# Patient Record
Sex: Female | Born: 1985
Health system: Southern US, Community
[De-identification: ages and names within clinical notes are randomized; demographics above are authoritative.]

## PROBLEM LIST (undated history)

## (undated) ENCOUNTER — Inpatient Hospital Stay (HOSPITAL_COMMUNITY): Payer: Self-pay

## (undated) DIAGNOSIS — N390 Urinary tract infection, site not specified: Secondary | ICD-10-CM

## (undated) DIAGNOSIS — Z8616 Personal history of COVID-19: Secondary | ICD-10-CM

## (undated) DIAGNOSIS — Z789 Other specified health status: Secondary | ICD-10-CM

## (undated) HISTORY — DX: Personal history of COVID-19: Z86.16

## (undated) HISTORY — PX: NO PAST SURGERIES: SHX2092

---

## 2005-08-15 ENCOUNTER — Emergency Department (HOSPITAL_COMMUNITY): Admission: EM | Admit: 2005-08-15 | Discharge: 2005-08-16 | Payer: Self-pay | Admitting: Emergency Medicine

## 2006-09-07 ENCOUNTER — Emergency Department (HOSPITAL_COMMUNITY): Admission: EM | Admit: 2006-09-07 | Discharge: 2006-09-07 | Payer: Self-pay | Admitting: Emergency Medicine

## 2007-06-29 ENCOUNTER — Emergency Department (HOSPITAL_COMMUNITY): Admission: EM | Admit: 2007-06-29 | Discharge: 2007-06-29 | Payer: Self-pay | Admitting: Emergency Medicine

## 2008-02-13 ENCOUNTER — Emergency Department (HOSPITAL_COMMUNITY): Admission: EM | Admit: 2008-02-13 | Discharge: 2008-02-13 | Payer: Self-pay | Admitting: Emergency Medicine

## 2008-03-28 ENCOUNTER — Inpatient Hospital Stay (HOSPITAL_COMMUNITY): Admission: AD | Admit: 2008-03-28 | Discharge: 2008-03-28 | Payer: Self-pay | Admitting: Obstetrics & Gynecology

## 2008-03-30 ENCOUNTER — Inpatient Hospital Stay (HOSPITAL_COMMUNITY): Admission: AD | Admit: 2008-03-30 | Discharge: 2008-03-30 | Payer: Self-pay | Admitting: Obstetrics & Gynecology

## 2008-04-06 ENCOUNTER — Inpatient Hospital Stay (HOSPITAL_COMMUNITY): Admission: AD | Admit: 2008-04-06 | Discharge: 2008-04-06 | Payer: Self-pay | Admitting: Obstetrics & Gynecology

## 2008-04-09 ENCOUNTER — Inpatient Hospital Stay (HOSPITAL_COMMUNITY): Admission: AD | Admit: 2008-04-09 | Discharge: 2008-04-09 | Payer: Self-pay | Admitting: Obstetrics & Gynecology

## 2008-04-27 ENCOUNTER — Inpatient Hospital Stay (HOSPITAL_COMMUNITY): Admission: AD | Admit: 2008-04-27 | Discharge: 2008-04-27 | Payer: Self-pay | Admitting: Family Medicine

## 2008-04-30 ENCOUNTER — Inpatient Hospital Stay (HOSPITAL_COMMUNITY): Admission: RE | Admit: 2008-04-30 | Discharge: 2008-04-30 | Payer: Self-pay | Admitting: Family Medicine

## 2008-07-18 ENCOUNTER — Inpatient Hospital Stay (HOSPITAL_COMMUNITY): Admission: AD | Admit: 2008-07-18 | Discharge: 2008-07-19 | Payer: Self-pay | Admitting: Obstetrics and Gynecology

## 2008-09-13 ENCOUNTER — Inpatient Hospital Stay (HOSPITAL_COMMUNITY): Admission: AD | Admit: 2008-09-13 | Discharge: 2008-09-13 | Payer: Self-pay | Admitting: Obstetrics and Gynecology

## 2008-10-26 ENCOUNTER — Inpatient Hospital Stay (HOSPITAL_COMMUNITY): Admission: AD | Admit: 2008-10-26 | Discharge: 2008-10-26 | Payer: Self-pay | Admitting: Obstetrics and Gynecology

## 2008-11-21 ENCOUNTER — Inpatient Hospital Stay (HOSPITAL_COMMUNITY): Admission: AD | Admit: 2008-11-21 | Discharge: 2008-11-21 | Payer: Self-pay | Admitting: Obstetrics and Gynecology

## 2008-12-02 ENCOUNTER — Inpatient Hospital Stay (HOSPITAL_COMMUNITY): Admission: AD | Admit: 2008-12-02 | Discharge: 2008-12-05 | Payer: Self-pay | Admitting: Obstetrics and Gynecology

## 2010-10-17 ENCOUNTER — Emergency Department (HOSPITAL_COMMUNITY)
Admission: EM | Admit: 2010-10-17 | Discharge: 2010-10-17 | Payer: Self-pay | Source: Home / Self Care | Admitting: Emergency Medicine

## 2011-01-11 LAB — CBC
HCT: 27.7 % — ABNORMAL LOW (ref 36.0–46.0)
HCT: 30.1 % — ABNORMAL LOW (ref 36.0–46.0)
HCT: 37.2 % (ref 36.0–46.0)
Hemoglobin: 10.3 g/dL — ABNORMAL LOW (ref 12.0–15.0)
Hemoglobin: 12.5 g/dL (ref 12.0–15.0)
Hemoglobin: 9.3 g/dL — ABNORMAL LOW (ref 12.0–15.0)
MCHC: 33.7 g/dL (ref 30.0–36.0)
MCHC: 33.7 g/dL (ref 30.0–36.0)
MCHC: 34.1 g/dL (ref 30.0–36.0)
MCV: 87.8 fL (ref 78.0–100.0)
MCV: 88.9 fL (ref 78.0–100.0)
MCV: 89.1 fL (ref 78.0–100.0)
Platelets: 119 10*3/uL — ABNORMAL LOW (ref 150–400)
Platelets: 150 10*3/uL (ref 150–400)
Platelets: 175 10*3/uL (ref 150–400)
RBC: 3.12 MIL/uL — ABNORMAL LOW (ref 3.87–5.11)
RBC: 3.38 MIL/uL — ABNORMAL LOW (ref 3.87–5.11)
RBC: 4.24 MIL/uL (ref 3.87–5.11)
RDW: 12.8 % (ref 11.5–15.5)
RDW: 12.8 % (ref 11.5–15.5)
RDW: 13.1 % (ref 11.5–15.5)
WBC: 10.5 10*3/uL (ref 4.0–10.5)
WBC: 11.5 10*3/uL — ABNORMAL HIGH (ref 4.0–10.5)
WBC: 8.2 10*3/uL (ref 4.0–10.5)

## 2011-01-11 LAB — T4, FREE: Free T4: 1.04 ng/dL (ref 0.89–1.80)

## 2011-01-11 LAB — T3: T3, Total: 234.4 ng/dl — ABNORMAL HIGH (ref 80.0–204.0)

## 2011-01-11 LAB — RPR: RPR Ser Ql: NONREACTIVE

## 2011-01-11 LAB — TSH: TSH: 2.671 u[IU]/mL (ref 0.350–4.500)

## 2011-01-11 LAB — T4: T4, Total: 12.1 ug/dL (ref 5.0–12.5)

## 2011-05-19 ENCOUNTER — Inpatient Hospital Stay (HOSPITAL_COMMUNITY)
Admission: AD | Admit: 2011-05-19 | Discharge: 2011-05-19 | Disposition: A | Discharge status: Home / Self Care | Payer: Medicaid Other | Source: Ambulatory Visit | Attending: Obstetrics and Gynecology | Admitting: Obstetrics and Gynecology

## 2011-05-19 ENCOUNTER — Inpatient Hospital Stay (HOSPITAL_COMMUNITY): Payer: Medicaid Other

## 2011-05-19 ENCOUNTER — Encounter (HOSPITAL_COMMUNITY): Payer: Self-pay

## 2011-05-19 DIAGNOSIS — R109 Unspecified abdominal pain: Secondary | ICD-10-CM | POA: Insufficient documentation

## 2011-05-19 DIAGNOSIS — O209 Hemorrhage in early pregnancy, unspecified: Secondary | ICD-10-CM

## 2011-05-19 HISTORY — DX: Other specified health status: Z78.9

## 2011-05-19 LAB — URINALYSIS, ROUTINE W REFLEX MICROSCOPIC
Bilirubin Urine: NEGATIVE
Glucose, UA: NEGATIVE mg/dL
Ketones, ur: NEGATIVE mg/dL
Leukocytes, UA: NEGATIVE
Nitrite: NEGATIVE
Protein, ur: NEGATIVE mg/dL
Specific Gravity, Urine: 1.02 (ref 1.005–1.030)
pH: 7.5 (ref 5.0–8.0)

## 2011-05-19 LAB — CBC
HCT: 37.2 % (ref 36.0–46.0)
Hemoglobin: 12.8 g/dL (ref 12.0–15.0)
MCH: 30.5 pg (ref 26.0–34.0)
MCHC: 34.4 g/dL (ref 30.0–36.0)
Platelets: 197 10*3/uL (ref 150–400)
RBC: 4.19 MIL/uL (ref 3.87–5.11)
RDW: 12.7 % (ref 11.5–15.5)
WBC: 6.7 10*3/uL (ref 4.0–10.5)

## 2011-05-19 LAB — POCT PREGNANCY, URINE: Preg Test, Ur: POSITIVE

## 2011-05-19 LAB — WET PREP, GENITAL
Clue Cells Wet Prep HPF POC: NONE SEEN
Trich, Wet Prep: NONE SEEN
Yeast Wet Prep HPF POC: NONE SEEN

## 2011-05-19 MED ORDER — ONDANSETRON HCL 4 MG PO TABS: 4.0000 mg | ORAL_TABLET | Freq: Three times a day (TID) | ORAL | Status: AC | PRN

## 2011-05-19 MED ORDER — ONDANSETRON 4 MG PO TBDP
4.0000 mg | ORAL_TABLET | Freq: Once | ORAL | Status: AC
  Administered 2011-05-19: 4 mg via ORAL
  Filled 2011-05-19: qty 1

## 2011-05-19 NOTE — Progress Notes (Signed)
Patient has not had confirmed pregnancy test, pain onset yesterday dull in lower abdomen, today stabbing abdominal pain today, blood is only in the toilet when she urinates, positive home pregnancy test, LMP 04/01/11

## 2011-05-19 NOTE — ED Provider Notes (Signed)
History   Pt presents today c/o Rt lower quad pain that began yesterday. She states the pain is now constant and "stabbing." She has been spotting on and off for the past week. Her last episode of intercourse was 2 days ago. She denies fever, urinary sx, vag irritation, or any other problems at this time.  Chief Complaint  Patient presents with  . Abdominal Cramping  . Vaginal Bleeding   HPI  OB History    Grav Para Term Preterm Abortions TAB SAB Ect Mult Living   2 1 1       1       Past Medical History  Diagnosis Date  . No pertinent past medical history     Past Surgical History  Procedure Date  . No past surgeries     No family history on file.  History  Substance Use Topics  . Smoking status: Never Smoker   . Smokeless tobacco: Not on file  . Alcohol Use: No    Allergies:  Allergies  Allergen Reactions  . Penicillins Hives    Prescriptions prior to admission  Medication Sig Dispense Refill  . ibuprofen (ADVIL,MOTRIN) 200 MG tablet Take 200 mg by mouth every 6 (six) hours as needed. For headache         Review of Systems  Constitutional: Negative for fever.  Gastrointestinal: Positive for nausea and abdominal pain. Negative for vomiting, diarrhea and constipation.  Genitourinary: Negative for dysuria, urgency, frequency, hematuria and flank pain.  Neurological: Negative for dizziness and headaches.  Psychiatric/Behavioral: Negative for depression and suicidal ideas.   Physical Exam   Blood pressure 110/63, pulse 71, temperature 98.7 F (37.1 C), temperature source Oral, resp. rate 16, height 5\' 4"  (1.626 m), weight 181 lb (82.101 kg), last menstrual period 04/01/2011.  Physical Exam  Constitutional: She is oriented to person, place, and time. She appears well-developed and well-nourished. No distress.  HENT:  Head: Normocephalic and atraumatic.  Eyes: EOM are normal. Pupils are equal, round, and reactive to light.  GI: Soft. She exhibits no distension  and no mass. There is tenderness. There is no rebound and no guarding.  Genitourinary: There is bleeding around the vagina. Vaginal discharge found.       Cervix Lg/closed. Blood-tinged dc is present in the vag vault. Pt is tender in Rt adnexa.  Neurological: She is alert and oriented to person, place, and time.  Skin: Skin is warm and dry. She is not diaphoretic.  Psychiatric: She has a normal mood and affect. Her behavior is normal. Judgment and thought content normal.    MAU Course  Procedures  Results for orders placed during the hospital encounter of 05/19/11 (from the past 24 hour(s))  URINALYSIS, ROUTINE W REFLEX MICROSCOPIC     Status: Abnormal   Collection Time   05/19/11 10:59 AM      Component Value Range   Color, Urine YELLOW  YELLOW    Appearance CLEAR  CLEAR    Specific Gravity, Urine 1.020  1.005 - 1.030    pH 7.5  5.0 - 8.0    Glucose, UA NEGATIVE  NEGATIVE (mg/dL)   Hgb urine dipstick NEGATIVE  NEGATIVE    Bilirubin Urine NEGATIVE  NEGATIVE    Ketones, ur NEGATIVE  NEGATIVE (mg/dL)   Protein, ur NEGATIVE  NEGATIVE (mg/dL)   Urobilinogen, UA 2.0 (*) 0.0 - 1.0 (mg/dL)   Nitrite NEGATIVE  NEGATIVE    Leukocytes, UA NEGATIVE  NEGATIVE   POCT PREGNANCY, URINE  Status: Normal   Collection Time   05/19/11 11:03 AM      Component Value Range   Preg Test, Ur POSITIVE    WET PREP, GENITAL     Status: Abnormal   Collection Time   05/19/11 11:11 AM      Component Value Range   Yeast, Wet Prep NONE SEEN  NONE SEEN    Trich, Wet Prep NONE SEEN  NONE SEEN    Clue Cells, Wet Prep NONE SEEN  NONE SEEN    WBC, Wet Prep HPF POC MODERATE (*) NONE SEEN   CBC     Status: Normal   Collection Time   05/19/11 11:11 AM      Component Value Range   WBC 6.7  4.0 - 10.5 (K/uL)   RBC 4.19  3.87 - 5.11 (MIL/uL)   Hemoglobin 12.8  12.0 - 15.0 (g/dL)   HCT 16.1  09.6 - 04.5 (%)   MCV 88.8  78.0 - 100.0 (fL)   MCH 30.5  26.0 - 34.0 (pg)   MCHC 34.4  30.0 - 36.0 (g/dL)   RDW 40.9   81.1 - 91.4 (%)   Platelets 197  150 - 400 (K/uL)  HCG, QUANTITATIVE, PREGNANCY     Status: Abnormal   Collection Time   05/19/11 11:11 AM      Component Value Range   hCG, Beta Chain, Quant, S [id number] (*) <5 (mIU/mL)   Pt is A positive.   US shows single living IUP with small subchorionic hemorrhage.  Assessment and Plan  Bleeding in preg: discussed with pt at length. Will give Rx for Zofran. She has f/u scheduled. Discussed diet, activity, risks, and precautions.  Clinton Gallant. Rice III, DrHSc, MPAS, PA-C  05/19/2011, 11:19 AM

## 2011-05-21 LAB — GC/CHLAMYDIA PROBE AMP, GENITAL
Chlamydia, DNA Probe: NEGATIVE
GC Probe Amp, Genital: NEGATIVE

## 2011-05-30 LAB — RUBELLA ANTIBODY, IGM: Rubella: IMMUNE

## 2011-05-30 LAB — HIV ANTIBODY (ROUTINE TESTING W REFLEX): HIV: NONREACTIVE

## 2011-06-23 ENCOUNTER — Inpatient Hospital Stay (HOSPITAL_COMMUNITY)
Admission: AD | Admit: 2011-06-23 | Discharge: 2011-06-23 | Disposition: A | Discharge status: Home / Self Care | Payer: Medicaid Other | Source: Ambulatory Visit | Attending: Obstetrics and Gynecology | Admitting: Obstetrics and Gynecology

## 2011-06-23 ENCOUNTER — Encounter (HOSPITAL_COMMUNITY): Payer: Self-pay | Admitting: *Deleted

## 2011-06-23 DIAGNOSIS — O209 Hemorrhage in early pregnancy, unspecified: Secondary | ICD-10-CM | POA: Diagnosis present

## 2011-06-23 NOTE — ED Provider Notes (Signed)
History     Chief Complaint  Patient presents with  . Vaginal Bleeding   HPI Large gush of blood about 20 minutes ago, no pain. Has had "minor dull pain" in low abd for the last 2 days. Intercourse this morning. Blood type A positive.   OB History    Grav Para Term Preterm Abortions TAB SAB Ect Mult Living   2 1 1       1       Past Medical History  Diagnosis Date  . No pertinent past medical history     Past Surgical History  Procedure Date  . No past surgeries     No family history on file.  History  Substance Use Topics  . Smoking status: Never Smoker   . Smokeless tobacco: Not on file  . Alcohol Use: No    Allergies:  Allergies  Allergen Reactions  . Penicillins Hives    Prescriptions prior to admission  Medication Sig Dispense Refill  . ibuprofen (ADVIL,MOTRIN) 200 MG tablet Take 200 mg by mouth every 6 (six) hours as needed. For headache         Review of Systems  Constitutional: Negative.   Respiratory: Negative.   Cardiovascular: Negative.   Gastrointestinal: Negative for nausea, vomiting, abdominal pain, diarrhea and constipation.  Genitourinary: Negative for dysuria, urgency, frequency, hematuria and flank pain.       Positive for vaginal bleeding   Musculoskeletal: Negative.   Neurological: Negative.   Psychiatric/Behavioral: Negative.    Physical Exam   Blood pressure 134/77, pulse 109, temperature 97.9 F (36.6 C), temperature source Oral, resp. rate 18, last menstrual period 04/01/2011.  Physical Exam  Nursing note and vitals reviewed. Constitutional: She is oriented to person, place, and time. She appears well-developed and well-nourished. No distress.  HENT:  Head: Normocephalic and atraumatic.  Cardiovascular: Normal rate.   Respiratory: Effort normal.  GI: Soft. Bowel sounds are normal. She exhibits no mass. There is no tenderness. There is no rebound and no guarding.  Genitourinary: There is no rash or lesion on the right labia.  There is no rash or lesion on the left labia. Uterus is not tender. Enlarged: Size c/w dates. Cervix exhibits no motion tenderness, no discharge and no friability. Right adnexum displays no mass, no tenderness and no fullness. Left adnexum displays no mass, no tenderness and no fullness. There is bleeding (moderate amount of blood and clots evacuated from vagina, no ongoing active bleeding) around the vagina. No tenderness around the vagina. No vaginal discharge found.  Musculoskeletal: Normal range of motion.  Neurological: She is alert and oriented to person, place, and time.  Skin: Skin is warm and dry.  Psychiatric: She has a normal mood and affect.    MAU Course  Procedures  Bedside u/s for cardiac activity only - + FHR    Assessment and Plan  25 y.o. G2P1001 at [redacted]w[redacted]d Vaginal bleeding  D/C home, limited bedrest and pelvic rest Has f/u appt on Wednesday of next week   Tracia Lacomb 06/23/2011, 2:40 PM

## 2011-06-23 NOTE — Progress Notes (Signed)
Onset of bleeding at work patient's pants saturated, mild cramping, [redacted] weeks pregnant

## 2011-06-24 ENCOUNTER — Encounter (HOSPITAL_COMMUNITY): Payer: Self-pay | Admitting: *Deleted

## 2011-06-24 ENCOUNTER — Inpatient Hospital Stay (HOSPITAL_COMMUNITY)
Admission: AD | Admit: 2011-06-24 | Discharge: 2011-06-24 | Disposition: A | Discharge status: Home / Self Care | Payer: Medicaid Other | Source: Ambulatory Visit | Attending: Obstetrics and Gynecology | Admitting: Obstetrics and Gynecology

## 2011-06-24 DIAGNOSIS — O210 Mild hyperemesis gravidarum: Secondary | ICD-10-CM | POA: Insufficient documentation

## 2011-06-24 DIAGNOSIS — O2650 Maternal hypotension syndrome, unspecified trimester: Secondary | ICD-10-CM | POA: Insufficient documentation

## 2011-06-24 DIAGNOSIS — O219 Vomiting of pregnancy, unspecified: Secondary | ICD-10-CM

## 2011-06-24 DIAGNOSIS — R55 Syncope and collapse: Secondary | ICD-10-CM

## 2011-06-24 HISTORY — DX: Urinary tract infection, site not specified: N39.0

## 2011-06-24 LAB — COMPREHENSIVE METABOLIC PANEL
AST: 19 U/L (ref 0–37)
CO2: 23 mEq/L (ref 19–32)
Chloride: 102 mEq/L (ref 96–112)
Creatinine, Ser: 0.52 mg/dL (ref 0.50–1.10)
GFR calc non Af Amer: >60 mL/min (ref >60)
Total Bilirubin: 0.3 mg/dL (ref 0.3–1.2)

## 2011-06-24 LAB — URINALYSIS, ROUTINE W REFLEX MICROSCOPIC
Bilirubin Urine: NEGATIVE
Protein, ur: NEGATIVE mg/dL
Urobilinogen, UA: 0.2 mg/dL (ref 0.0–1.0)

## 2011-06-24 LAB — CBC
HCT: 36.7 % (ref 36.0–46.0)
Hemoglobin: 12.7 g/dL (ref 12.0–15.0)
MCV: 88 fL (ref 78.0–100.0)
Platelets: 179 10*3/uL (ref 150–400)
RBC: 4.17 MIL/uL (ref 3.87–5.11)
WBC: 8.5 10*3/uL (ref 4.0–10.5)

## 2011-06-24 LAB — URINE MICROSCOPIC-ADD ON

## 2011-06-24 MED ORDER — ONDANSETRON HCL 4 MG/2ML IJ SOLN
4.0000 mg | Freq: Once | INTRAMUSCULAR | Status: AC
  Administered 2011-06-24: 4 mg via INTRAVENOUS
  Filled 2011-06-24: qty 2

## 2011-06-24 NOTE — ED Provider Notes (Signed)
History     CSN: [id number] Arrival date & time: 06/24/2011  7:38 PM  Chief Complaint  Patient presents with  . Near Syncope  . Abdominal Pain    HPI   HPI Katie Murray is a 25 y.o. female who arrived to MAU via EMS. She is [redacted] weeks gestation with nausea, vomiting and near syncope episode prior to arrival. IV of NS infusing. The patient was here yesterday for vaginal bleeding but no nausea, vomiting at that time. The history was provided by the patient.  Past Medical History  Diagnosis Date  . No pertinent past medical history   . Urinary tract infection     Past Surgical History  Procedure Date  . No past surgeries     Family History  Problem Relation Age of Onset  . Heart disease Maternal Grandfather     History  Substance Use Topics  . Smoking status: Former Smoker -- 0.2 packs/day for 2 years    Types: Cigarettes  . Smokeless tobacco: Not on file  . Alcohol Use: No    OB History    Grav Para Term Preterm Abortions TAB SAB Ect Mult Living   2 1 1       1       Review of Systems  Review of Systems  Constitutional: Positive for appetite change. Negative for fever, chills, diaphoresis and fatigue.  HENT: Negative for ear pain, congestion, sore throat, facial swelling, neck pain, neck stiffness, dental problem and sinus pressure.   Eyes: Negative for photophobia, pain and discharge.  Respiratory: Positive for cough. Negative for chest tightness and wheezing.   Cardiovascular: Negative.   Gastrointestinal: Positive for nausea and vomiting. Negative for abdominal pain, diarrhea, constipation and abdominal distention.       Mild cramping  Genitourinary: Negative for dysuria, frequency, flank pain and difficulty urinating.  Musculoskeletal: Positive for back pain. Negative for myalgias and gait problem.  Skin: Positive for rash. Negative for color change.  Neurological: Positive for dizziness and light-headedness. Negative for speech difficulty, weakness, numbness  and headaches.  Psychiatric/Behavioral: Negative for confusion and agitation.    Allergies  Penicillins  Home Medications  No current outpatient prescriptions on file.  Physical Exam    BP 101/53  Pulse 64  Temp(Src) 97.9 F (36.6 C) (Oral)  Resp 16  SpO2 99%  LMP 04/01/2011  Physical Exam  Nursing note and vitals reviewed. Constitutional: She is oriented to person, place, and time. She appears well-developed and well-nourished.  HENT:  Head: Normocephalic.  Eyes: EOM are normal.  Neck: Neck supple.  Cardiovascular: Normal rate and regular rhythm.   Pulmonary/Chest: Effort normal and breath sounds normal.  Abdominal: Soft. There is no tenderness.  Musculoskeletal: Normal range of motion.  Neurological: She is alert and oriented to person, place, and time. She has normal strength and normal reflexes. No cranial nerve deficit. Gait abnormal.  Skin: Skin is warm and dry.  Psychiatric: She has a normal mood and affect.   Patient feeling much better after IV hydration. Taking po fluids without difficulty. No nausea or vomiting. No dizziness.  RN unable to doppler FHT.   Informal bedside ultrasound shows active IUP, able to visualize cardiac activity.  ED Course: Consult with Dr. Henderson Cloud  Procedures  Results for orders placed during the hospital encounter of 06/24/11 (from the past 24 hour(s))  CBC     Status: Normal   Collection Time   06/24/11  8:48 PM      Component  Value Range   WBC 8.5  4.0 - 10.5 (K/uL)   RBC 4.17  3.87 - 5.11 (MIL/uL)   Hemoglobin 12.7  12.0 - 15.0 (g/dL)   HCT 16.1  09.6 - 04.5 (%)   MCV 88.0  78.0 - 100.0 (fL)   MCH 30.5  26.0 - 34.0 (pg)   MCHC 34.6  30.0 - 36.0 (g/dL)   RDW 40.9  81.1 - 91.4 (%)   Platelets 179  150 - 400 (K/uL)  COMPREHENSIVE METABOLIC PANEL     Status: Abnormal   Collection Time   06/24/11  8:48 PM      Component Value Range   Sodium 135  135 - 145 (mEq/L)   Potassium 3.7  3.5 - 5.1 (mEq/L)   Chloride 102  96 - 112  (mEq/L)   CO2 23  19 - 32 (mEq/L)   Glucose, Bld 106 (*) 70 - 99 (mg/dL)   BUN 6  6 - 23 (mg/dL)   Creatinine, Ser 7.82  0.50 - 1.10 (mg/dL)   Calcium 9.3  8.4 - 95.6 (mg/dL)   Total Protein 6.7  6.0 - 8.3 (g/dL)   Albumin 3.2 (*) 3.5 - 5.2 (g/dL)   AST 19  0 - 37 (U/L)   ALT 13  0 - 35 (U/L)   Alkaline Phosphatase 55  39 - 117 (U/L)   Total Bilirubin 0.3  0.3 - 1.2 (mg/dL)   GFR calc non Af Amer >60  >60 (mL/min)   GFR calc Af Amer >60  >60 (mL/min)    Assessment:  Near syncopal episode in first trimester pregnancy   Nausea, vomiting in first trimester pregnancy  Plan:  Discussed clinical and lab findings with Dr. Henderson Cloud   IV hydration    Zofran 4 mg. IV   Rest, po hydration   Follow up in the office         [facility], NP 06/24/11 2219

## 2011-06-24 NOTE — Progress Notes (Signed)
Pt came in via EMS after having a dizzy spell with vomiting.  IV started en route.  Pt states she feels better now.

## 2011-06-24 NOTE — Progress Notes (Signed)
Pt arrived via EMS with dizziness, weakness, N/V and LLQ pain.  Pt G2 P1 at 12wks.  Pt was seen in MAU 9/22 for vag bleeding.

## 2011-06-24 NOTE — Progress Notes (Signed)
Undetected FHT's noted with doppler, bedside scan showed cardiac activity.

## 2011-06-24 NOTE — Progress Notes (Signed)
Pt tolerated apple juice well.

## 2011-06-27 LAB — URINALYSIS, ROUTINE W REFLEX MICROSCOPIC
Glucose, UA: NEGATIVE
Hgb urine dipstick: NEGATIVE
Specific Gravity, Urine: 1.016
pH: 6

## 2011-06-27 LAB — URINE MICROSCOPIC-ADD ON

## 2011-06-27 LAB — POCT PREGNANCY, URINE
Operator id: 161631
Preg Test, Ur: NEGATIVE

## 2011-06-28 LAB — CBC
Hemoglobin: 14
MCV: 90.1
Platelets: 209
RBC: 4.48
RBC: 4.14
RDW: 12.1
WBC: 6.6

## 2011-06-28 LAB — SAMPLE TO BLOOD BANK

## 2011-06-28 LAB — URINALYSIS, ROUTINE W REFLEX MICROSCOPIC
Glucose, UA: NEGATIVE
Hgb urine dipstick: NEGATIVE
Specific Gravity, Urine: 1.015
Urobilinogen, UA: 0.2

## 2011-06-28 LAB — HCG, QUANTITATIVE, PREGNANCY
hCG, Beta Chain, Quant, S: 26 — ABNORMAL HIGH
hCG, Beta Chain, Quant, S: 4311 — ABNORMAL HIGH

## 2011-06-28 LAB — GC/CHLAMYDIA PROBE AMP, GENITAL: Chlamydia, DNA Probe: NEGATIVE

## 2011-06-28 LAB — POCT PREGNANCY, URINE
Operator id: 28886
Preg Test, Ur: NEGATIVE

## 2011-06-28 LAB — WET PREP, GENITAL

## 2011-06-29 LAB — CBC
HCT: 36
Hemoglobin: 12.3
RBC: 4
WBC: 6.4

## 2011-06-29 LAB — URINALYSIS, ROUTINE W REFLEX MICROSCOPIC
Glucose, UA: NEGATIVE
pH: 5.5

## 2011-07-02 LAB — WET PREP, GENITAL
Clue Cells Wet Prep HPF POC: NONE SEEN
Trich, Wet Prep: NONE SEEN

## 2011-07-02 LAB — CBC
MCHC: 34.2
RBC: 3.85 — ABNORMAL LOW
WBC: 8.8

## 2011-07-02 LAB — URINALYSIS, ROUTINE W REFLEX MICROSCOPIC
Bilirubin Urine: NEGATIVE
Glucose, UA: NEGATIVE
Hgb urine dipstick: NEGATIVE
Nitrite: NEGATIVE
Specific Gravity, Urine: <1.005 — ABNORMAL LOW
pH: 5.5

## 2011-07-06 LAB — URINALYSIS, ROUTINE W REFLEX MICROSCOPIC
Glucose, UA: NEGATIVE mg/dL
Protein, ur: NEGATIVE mg/dL

## 2011-07-12 LAB — BASIC METABOLIC PANEL
Calcium: 9.5
Chloride: 105
Creatinine, Ser: 0.65
GFR calc Af Amer: >60
GFR calc non Af Amer: >60

## 2011-07-12 LAB — CBC
MCV: 88.8
RBC: 4.52
WBC: 9.5

## 2011-07-12 LAB — DIFFERENTIAL
Lymphocytes Relative: 20
Lymphs Abs: 1.9
Monocytes Relative: 5
Neutro Abs: 7.2
Neutrophils Relative %: 76

## 2011-12-06 ENCOUNTER — Inpatient Hospital Stay (HOSPITAL_COMMUNITY)
Admission: AD | Admit: 2011-12-06 | Discharge: 2011-12-06 | Disposition: A | Discharge status: Home / Self Care | Payer: Medicaid Other | Source: Ambulatory Visit | Attending: Obstetrics and Gynecology | Admitting: Obstetrics and Gynecology

## 2011-12-06 ENCOUNTER — Encounter (HOSPITAL_COMMUNITY): Payer: Self-pay | Admitting: *Deleted

## 2011-12-06 DIAGNOSIS — A09 Infectious gastroenteritis and colitis, unspecified: Secondary | ICD-10-CM

## 2011-12-06 DIAGNOSIS — A084 Viral intestinal infection, unspecified: Secondary | ICD-10-CM

## 2011-12-06 DIAGNOSIS — O212 Late vomiting of pregnancy: Secondary | ICD-10-CM | POA: Insufficient documentation

## 2011-12-06 LAB — CBC
HCT: 39.5 % (ref 36.0–46.0)
MCH: 29.7 pg (ref 26.0–34.0)
MCHC: 33.4 g/dL (ref 30.0–36.0)
RDW: 12.6 % (ref 11.5–15.5)

## 2011-12-06 LAB — URINALYSIS, ROUTINE W REFLEX MICROSCOPIC
Hgb urine dipstick: NEGATIVE
Ketones, ur: >80 mg/dL — AB
Protein, ur: NEGATIVE mg/dL
Urobilinogen, UA: 1 mg/dL (ref 0.0–1.0)

## 2011-12-06 LAB — COMPREHENSIVE METABOLIC PANEL
ALT: 33 U/L (ref 0–35)
Alkaline Phosphatase: 147 U/L — ABNORMAL HIGH (ref 39–117)
CO2: 21 mEq/L (ref 19–32)
Chloride: 99 mEq/L (ref 96–112)
GFR calc Af Amer: >90 mL/min (ref >90)
GFR calc non Af Amer: >90 mL/min (ref >90)
Glucose, Bld: 105 mg/dL — ABNORMAL HIGH (ref 70–99)
Potassium: 3.8 mEq/L (ref 3.5–5.1)
Sodium: 133 mEq/L — ABNORMAL LOW (ref 135–145)

## 2011-12-06 MED ORDER — PROMETHAZINE HCL 25 MG/ML IJ SOLN
12.5000 mg | INTRAMUSCULAR | Status: AC
  Administered 2011-12-06: 12.5 mg via INTRAVENOUS
  Filled 2011-12-06: qty 1

## 2011-12-06 MED ORDER — LACTATED RINGERS IV BOLUS (SEPSIS)
1000.0000 mL | INTRAVENOUS | Status: AC
  Administered 2011-12-06: 1000 mL via INTRAVENOUS

## 2011-12-06 MED ORDER — PROMETHAZINE HCL 12.5 MG PO TABS: 12.5000 mg | ORAL_TABLET | Freq: Four times a day (QID) | ORAL | Status: AC | PRN

## 2011-12-06 MED ORDER — LACTATED RINGERS IV SOLN: INTRAVENOUS | Status: DC

## 2011-12-06 NOTE — Progress Notes (Signed)
Pt in c/o nausea and vomiting since 1900 last night.  Reports 3 bowel movements last night when everything started, none since.  Denies any bleeding or lof.  + FM.

## 2011-12-06 NOTE — Discharge Instructions (Signed)
Viral Gastroenteritis Viral gastroenteritis is also known as stomach flu. This condition affects the stomach and intestinal tract. It can cause sudden diarrhea and vomiting. The illness typically lasts 3 to 8 days. Most people develop an immune response that eventually gets rid of the virus. While this natural response develops, the virus can make you quite ill. CAUSES  Many different viruses can cause gastroenteritis, such as rotavirus or noroviruses. You can catch one of these viruses by consuming contaminated food or water. You may also catch a virus by sharing utensils or other personal items with an infected person or by touching a contaminated surface. SYMPTOMS  The most common symptoms are diarrhea and vomiting. These problems can cause a severe loss of body fluids (dehydration) and a body salt (electrolyte) imbalance. Other symptoms may include:  Fever.   Headache.   Fatigue.   Abdominal pain.  DIAGNOSIS  Your caregiver can usually diagnose viral gastroenteritis based on your symptoms and a physical exam. A stool sample may also be taken to test for the presence of viruses or other infections. TREATMENT  This illness typically goes away on its own. Treatments are aimed at rehydration. The most serious cases of viral gastroenteritis involve vomiting so severely that you are not able to keep fluids down. In these cases, fluids must be given through an intravenous line (IV). HOME CARE INSTRUCTIONS   Drink enough fluids to keep your urine clear or pale yellow. Drink small amounts of fluids frequently and increase the amounts as tolerated.   Ask your caregiver for specific rehydration instructions.   Avoid:   Foods high in sugar.   Alcohol.   Carbonated drinks.   Tobacco.   Juice.   Caffeine drinks.   Extremely hot or cold fluids.   Fatty, greasy foods.   Too much intake of anything at one time.   Dairy products until 24 to 48 hours after diarrhea stops.   You may  consume probiotics. Probiotics are active cultures of beneficial bacteria. They may lessen the amount and number of diarrheal stools in adults. Probiotics can be found in yogurt with active cultures and in supplements.   Wash your hands well to avoid spreading the virus.   Only take over-the-counter or prescription medicines for pain, discomfort, or fever as directed by your caregiver. Do not give aspirin to children. Antidiarrheal medicines are not recommended.   Ask your caregiver if you should continue to take your regular prescribed and over-the-counter medicines.   Keep all follow-up appointments as directed by your caregiver.  SEEK IMMEDIATE MEDICAL CARE IF:   You are unable to keep fluids down.   You do not urinate at least once every 6 to 8 hours.   You develop shortness of breath.   You notice blood in your stool or vomit. This may look like coffee grounds.   You have abdominal pain that increases or is concentrated in one small area (localized).   You have persistent vomiting or diarrhea.   You have a fever.   The patient is a child younger than 3 months, and he or she has a fever.   The patient is a child older than 3 months, and he or she has a fever and persistent symptoms.   The patient is a child older than 3 months, and he or she has a fever and symptoms suddenly get worse.   The patient is a baby, and he or she has no tears when crying.  MAKE SURE YOU:     Understand these instructions.   Will watch your condition.   Will get help right away if you are not doing well or get worse.  Document Released: 09/17/2005 Document Revised: 09/06/2011 Document Reviewed: 07/04/2011 [employer] Patient Information 2012 [employer]. 

## 2011-12-06 NOTE — ED Provider Notes (Signed)
History    26 y.o.G2P1001 @[redacted]w[redacted]d  Chief Complaint  Patient presents with  . Emesis  . Nausea   HPI Pt presents with n/v and frequent bowel movements since 7pm yesterday.  She has been unable to keep any food or liquids down since last night.  No other family members have had these symptoms.  She reports some irregular mild cramping since vomiting started.  She denies LOF, vaginal bleeding, vaginal itching/discharge, urinary symptoms, dizziness, fever/chills, or h/a.  OB History    Grav Para Term Preterm Abortions TAB SAB Ect Mult Living   2 1 1       1       Past Medical History  Diagnosis Date  . No pertinent past medical history   . Urinary tract infection     Past Surgical History  Procedure Date  . No past surgeries     Family History  Problem Relation Age of Onset  . Heart disease Maternal Grandfather     History  Substance Use Topics  . Smoking status: Former Smoker -- 0.2 packs/day for 2 years    Types: Cigarettes  . Smokeless tobacco: Not on file  . Alcohol Use: No    Allergies:  Allergies  Allergen Reactions  . Penicillins Hives    Prescriptions prior to admission  Medication Sig Dispense Refill  . valACYclovir (VALTREX) 500 MG tablet Take 500 mg by mouth daily.        Review of Systems  Constitutional: Negative for fever, chills and malaise/fatigue.  Eyes: Negative for blurred vision.  Respiratory: Negative for cough and shortness of breath.   Cardiovascular: Negative for chest pain.  Gastrointestinal: Negative for heartburn, nausea and vomiting.  Genitourinary: Negative for dysuria, urgency and frequency.  Musculoskeletal: Negative.   Neurological: Negative for dizziness and headaches.  Psychiatric/Behavioral: Negative for depression.   Physical Exam   Blood pressure 109/68, pulse 103, temperature 97.2 F (36.2 C), temperature source Oral, resp. rate 18, height 5\' 4"  (1.626 m), weight 87.544 kg (193 lb), last menstrual period  04/01/2011.  Physical Exam  Nursing note and vitals reviewed. Constitutional: She is oriented to person, place, and time. She appears well-developed and well-nourished.  Neck: Normal range of motion.  Cardiovascular: Normal rate.   Respiratory: Effort normal.  GI: Soft.  Musculoskeletal: Normal range of motion.  Neurological: She is alert and oriented to person, place, and time.  Skin: Skin is warm and dry.  Psychiatric: She has a normal mood and affect. Her behavior is normal. Judgment and thought content normal.  Neg CVA tenderness Neg abdominal tenderness  Cervix 2/50/-2, posterior at 1115 am Cervix unchanged at 1:30 pm  EFM: FHR baseline 145 with moderate variability, accels, and no decels.  Contractions every 3-5 minutes upon arrival, decreasing to occasional contractions after IV fluids.  Results for orders placed during the hospital encounter of 12/06/11 (from the past 24 hour(s))  COMPREHENSIVE METABOLIC PANEL     Status: Abnormal   Collection Time   12/06/11 10:40 AM      Component Value Range   Sodium 133 (*) 135 - 145 (mEq/L)   Potassium 3.8  3.5 - 5.1 (mEq/L)   Chloride 99  96 - 112 (mEq/L)   CO2 21  19 - 32 (mEq/L)   Glucose, Bld 105 (*) 70 - 99 (mg/dL)   BUN 12  6 - 23 (mg/dL)   Creatinine, Ser 1.61  0.50 - 1.10 (mg/dL)   Calcium 9.0  8.4 - 09.6 (mg/dL)   Total  Protein 6.1  6.0 - 8.3 (g/dL)   Albumin 2.5 (*) 3.5 - 5.2 (g/dL)   AST 29  0 - 37 (U/L)   ALT 33  0 - 35 (U/L)   Alkaline Phosphatase 147 (*) 39 - 117 (U/L)   Total Bilirubin 0.7  0.3 - 1.2 (mg/dL)   GFR calc non Af Amer >90  >90 (mL/min)   GFR calc Af Amer >90  >90 (mL/min)  CBC     Status: Normal   Collection Time   12/06/11 10:40 AM      Component Value Range   WBC 8.0  4.0 - 10.5 (K/uL)   RBC 4.44  3.87 - 5.11 (MIL/uL)   Hemoglobin 13.2  12.0 - 15.0 (g/dL)   HCT 91.4  78.2 - 95.6 (%)   MCV 89.0  78.0 - 100.0 (fL)   MCH 29.7  26.0 - 34.0 (pg)   MCHC 33.4  30.0 - 36.0 (g/dL)   RDW 21.3  08.6 -  57.8 (%)   Platelets 171  150 - 400 (K/uL)  URINALYSIS, ROUTINE W REFLEX MICROSCOPIC     Status: Abnormal   Collection Time   12/06/11 12:09 PM      Component Value Range   Color, Urine YELLOW  YELLOW    APPearance CLEAR  CLEAR    Specific Gravity, Urine >1.030 (*) 1.005 - 1.030    pH 6.0  5.0 - 8.0    Glucose, UA NEGATIVE  NEGATIVE (mg/dL)   Hgb urine dipstick NEGATIVE  NEGATIVE    Bilirubin Urine SMALL (*) NEGATIVE    Ketones, ur >80 (*) NEGATIVE (mg/dL)   Protein, ur NEGATIVE  NEGATIVE (mg/dL)   Urobilinogen, UA 1.0  0.0 - 1.0 (mg/dL)   Nitrite NEGATIVE  NEGATIVE    Leukocytes, UA NEGATIVE  NEGATIVE     MAU Course  Procedures U/a, IV bolus LR, IV phenergan  MDM Called Dr Marcelle Overlie with results.  Plan to D/C home with phenergan PO.  Assessment and Plan  D/C home with PTL precautions Phenergan 12.5 mg PO Q6 hours PRN Return to MAU as needed  LEFTWICH-KIRBY, Jeison Delpilar 12/06/2011, 10:02 AM

## 2011-12-25 ENCOUNTER — Encounter (HOSPITAL_COMMUNITY): Payer: Self-pay | Admitting: *Deleted

## 2011-12-25 ENCOUNTER — Inpatient Hospital Stay (HOSPITAL_COMMUNITY): Payer: Medicaid Other | Admitting: Anesthesiology

## 2011-12-25 ENCOUNTER — Encounter (HOSPITAL_COMMUNITY): Payer: Self-pay | Admitting: Anesthesiology

## 2011-12-25 ENCOUNTER — Inpatient Hospital Stay (HOSPITAL_COMMUNITY)
Admission: AD | Admit: 2011-12-25 | Discharge: 2011-12-27 | DRG: 775 | Disposition: A | Discharge status: Home / Self Care | Payer: Medicaid Other | Attending: Obstetrics and Gynecology | Admitting: Obstetrics and Gynecology

## 2011-12-25 ENCOUNTER — Encounter (HOSPITAL_COMMUNITY): Payer: Self-pay

## 2011-12-25 DIAGNOSIS — O209 Hemorrhage in early pregnancy, unspecified: Secondary | ICD-10-CM

## 2011-12-25 LAB — CBC
MCH: 29.6 pg (ref 26.0–34.0)
MCV: 87.6 fL (ref 78.0–100.0)
Platelets: 157 10*3/uL (ref 150–400)
RBC: 4.02 MIL/uL (ref 3.87–5.11)
RDW: 12.6 % (ref 11.5–15.5)

## 2011-12-25 MED ORDER — DIBUCAINE 1 % RE OINT: 1.0000 "application " | TOPICAL_OINTMENT | RECTAL | Status: DC | PRN

## 2011-12-25 MED ORDER — SIMETHICONE 80 MG PO CHEW: 80.0000 mg | CHEWABLE_TABLET | ORAL | Status: DC | PRN

## 2011-12-25 MED ORDER — LACTATED RINGERS IV SOLN
500.0000 mL | Freq: Once | INTRAVENOUS | Status: AC
  Administered 2011-12-25: 12:00:00 via INTRAVENOUS

## 2011-12-25 MED ORDER — OXYCODONE-ACETAMINOPHEN 5-325 MG PO TABS: 1.0000 | ORAL_TABLET | ORAL | Status: DC | PRN

## 2011-12-25 MED ORDER — ACETAMINOPHEN 325 MG PO TABS: 650.0000 mg | ORAL_TABLET | ORAL | Status: DC | PRN

## 2011-12-25 MED ORDER — SENNOSIDES-DOCUSATE SODIUM 8.6-50 MG PO TABS
2.0000 | ORAL_TABLET | Freq: Every day | ORAL | Status: DC
  Administered 2011-12-25 – 2011-12-26 (×2): 2 via ORAL

## 2011-12-25 MED ORDER — DIPHENHYDRAMINE HCL 50 MG/ML IJ SOLN: 12.5000 mg | INTRAMUSCULAR | Status: DC | PRN

## 2011-12-25 MED ORDER — ONDANSETRON HCL 4 MG/2ML IJ SOLN: 4.0000 mg | Freq: Four times a day (QID) | INTRAMUSCULAR | Status: DC | PRN

## 2011-12-25 MED ORDER — OXYTOCIN 20 UNITS IN LACTATED RINGERS INFUSION - SIMPLE
125.0000 mL/h | Freq: Once | INTRAVENOUS | Status: AC
  Administered 2011-12-25: 125 mL/h via INTRAVENOUS

## 2011-12-25 MED ORDER — LACTATED RINGERS IV SOLN: 500.0000 mL | Freq: Once | INTRAVENOUS | Status: DC

## 2011-12-25 MED ORDER — WITCH HAZEL-GLYCERIN EX PADS: 1.0000 "application " | MEDICATED_PAD | CUTANEOUS | Status: DC | PRN

## 2011-12-25 MED ORDER — LACTATED RINGERS IV SOLN
INTRAVENOUS | Status: DC
  Administered 2011-12-25 (×3): via INTRAVENOUS

## 2011-12-25 MED ORDER — MEDROXYPROGESTERONE ACETATE 150 MG/ML IM SUSP: 150.0000 mg | INTRAMUSCULAR | Status: DC | PRN

## 2011-12-25 MED ORDER — EPHEDRINE 5 MG/ML INJ: 10.0000 mg | INTRAVENOUS | Status: DC | PRN

## 2011-12-25 MED ORDER — EPHEDRINE 5 MG/ML INJ
10.0000 mg | INTRAVENOUS | Status: DC | PRN
  Filled 2011-12-25: qty 4

## 2011-12-25 MED ORDER — LACTATED RINGERS IV SOLN: 500.0000 mL | INTRAVENOUS | Status: DC | PRN

## 2011-12-25 MED ORDER — CITRIC ACID-SODIUM CITRATE 334-500 MG/5ML PO SOLN: 30.0000 mL | ORAL | Status: DC | PRN

## 2011-12-25 MED ORDER — LIDOCAINE HCL (PF) 1 % IJ SOLN: 30.0000 mL | INTRAMUSCULAR | Status: DC | PRN

## 2011-12-25 MED ORDER — TERBUTALINE SULFATE 1 MG/ML IJ SOLN: 0.2500 mg | Freq: Once | INTRAMUSCULAR | Status: DC | PRN

## 2011-12-25 MED ORDER — LANOLIN HYDROUS EX OINT: TOPICAL_OINTMENT | CUTANEOUS | Status: DC | PRN

## 2011-12-25 MED ORDER — ONDANSETRON HCL 4 MG/2ML IJ SOLN: 4.0000 mg | INTRAMUSCULAR | Status: DC | PRN

## 2011-12-25 MED ORDER — PHENYLEPHRINE 40 MCG/ML (10ML) SYRINGE FOR IV PUSH (FOR BLOOD PRESSURE SUPPORT): 80.0000 ug | PREFILLED_SYRINGE | INTRAVENOUS | Status: DC | PRN

## 2011-12-25 MED ORDER — FENTANYL 2.5 MCG/ML BUPIVACAINE 1/10 % EPIDURAL INFUSION (WH - ANES): 14.0000 mL/h | INTRAMUSCULAR | Status: DC

## 2011-12-25 MED ORDER — BENZOCAINE-MENTHOL 20-0.5 % EX AERO: 1.0000 "application " | INHALATION_SPRAY | CUTANEOUS | Status: DC | PRN

## 2011-12-25 MED ORDER — ONDANSETRON HCL 4 MG PO TABS: 4.0000 mg | ORAL_TABLET | ORAL | Status: DC | PRN

## 2011-12-25 MED ORDER — PRENATAL MULTIVITAMIN CH
1.0000 | ORAL_TABLET | Freq: Every day | ORAL | Status: DC
  Administered 2011-12-26 – 2011-12-27 (×2): 1 via ORAL
  Filled 2011-12-25 (×2): qty 1

## 2011-12-25 MED ORDER — FLEET ENEMA 7-19 GM/118ML RE ENEM: 1.0000 | ENEMA | RECTAL | Status: DC | PRN

## 2011-12-25 MED ORDER — PHENYLEPHRINE 40 MCG/ML (10ML) SYRINGE FOR IV PUSH (FOR BLOOD PRESSURE SUPPORT)
80.0000 ug | PREFILLED_SYRINGE | INTRAVENOUS | Status: DC | PRN
  Filled 2011-12-25: qty 5

## 2011-12-25 MED ORDER — DIPHENHYDRAMINE HCL 25 MG PO CAPS: 25.0000 mg | ORAL_CAPSULE | Freq: Four times a day (QID) | ORAL | Status: DC | PRN

## 2011-12-25 MED ORDER — TETANUS-DIPHTH-ACELL PERTUSSIS 5-2.5-18.5 LF-MCG/0.5 IM SUSP
0.5000 mL | Freq: Once | INTRAMUSCULAR | Status: DC
  Filled 2011-12-25: qty 0.5

## 2011-12-25 MED ORDER — OXYTOCIN BOLUS FROM INFUSION
500.0000 mL | Freq: Once | INTRAVENOUS | Status: DC
  Filled 2011-12-25: qty 500
  Filled 2011-12-25: qty 1000

## 2011-12-25 MED ORDER — OXYTOCIN 20 UNITS IN LACTATED RINGERS INFUSION - SIMPLE
1.0000 m[IU]/min | INTRAVENOUS | Status: DC
  Administered 2011-12-25: 2 m[IU]/min via INTRAVENOUS
  Administered 2011-12-25: 4 m[IU]/min via INTRAVENOUS

## 2011-12-25 MED ORDER — IBUPROFEN 600 MG PO TABS
600.0000 mg | ORAL_TABLET | Freq: Four times a day (QID) | ORAL | Status: DC | PRN
  Administered 2011-12-25: 600 mg via ORAL
  Filled 2011-12-25: qty 1

## 2011-12-25 MED ORDER — FENTANYL 2.5 MCG/ML BUPIVACAINE 1/10 % EPIDURAL INFUSION (WH - ANES)
14.0000 mL/h | INTRAMUSCULAR | Status: DC
  Administered 2011-12-25: 14 mL/h via EPIDURAL
  Filled 2011-12-25 (×2): qty 60

## 2011-12-25 MED ORDER — MEASLES, MUMPS & RUBELLA VAC ~~LOC~~ INJ: 0.5000 mL | INJECTION | Freq: Once | SUBCUTANEOUS | Status: DC

## 2011-12-25 MED ORDER — IBUPROFEN 600 MG PO TABS
600.0000 mg | ORAL_TABLET | Freq: Four times a day (QID) | ORAL | Status: DC
  Administered 2011-12-26 – 2011-12-27 (×6): 600 mg via ORAL
  Filled 2011-12-25 (×6): qty 1

## 2011-12-25 NOTE — Progress Notes (Signed)
Pt getting comfortable, just got epidural.  FHT reasuring Toco Q3 Cvx 4-5cm per RN exam  A/P:  Exp mngt Pitocin augmentation

## 2011-12-25 NOTE — Anesthesia Preprocedure Evaluation (Signed)

## 2011-12-25 NOTE — Progress Notes (Signed)
Pt may go to room 163. 

## 2011-12-25 NOTE — Progress Notes (Signed)
Pt somewhat uncomfortable w/ ctx.  Continues to have LOF, clear.  No vb.  + FM GBS neg  +FHT reassuring Toco Q5 Cvx 2-3/90/-2  A/P:  Augment w/ pitocin

## 2011-12-25 NOTE — MAU Note (Signed)
Pt G2 P1 at 38.2wks, contractions and pressure since 2230, leaking clear/pinkish fluid since 0100.  Taking valtrex for HSV, no signs of outbreak.

## 2011-12-25 NOTE — Progress Notes (Signed)
Dr. Henderson Cloud notified of pt presenting for labor check. Notified of VE and ctx pattern with SROM. Notified of GBS neg with hx of HSV. Admit orders received.

## 2011-12-25 NOTE — Progress Notes (Signed)
SVD of vigerous female infant w/ apgars of 9,9.  Placenta delivered spontaneous w/ 3VC.   Fundus firm.  EBL 400cc .   

## 2011-12-25 NOTE — H&P (Signed)
Katie Murray is a 26 y.o. female presenting for SROM and UCs. Maternal Medical History:  Reason for admission: Reason for admission: rupture of membranes.  Contractions: Onset was 6-12 hours ago.    Fetal activity: Perceived fetal activity is normal.      OB History    Grav Para Term Preterm Abortions TAB SAB Ect Mult Living   2 1 1       1      Past Medical History  Diagnosis Date  . No pertinent past medical history   . Urinary tract infection    Past Surgical History  Procedure Date  . No past surgeries    Family History: family history includes Heart disease in her maternal grandfather. Social History:  reports that she has quit smoking. Her smoking use included Cigarettes. She has a .5 pack-year smoking history. She does not have any smokeless tobacco history on file. She reports that she does not drink alcohol or use illicit drugs.  Review of Systems  Eyes: Negative for blurred vision.  Neurological: Negative for headaches.    Dilation: 4 Effacement (%): 90 Station: -2;-3 Exam by:: Penley, RN  Blood pressure 121/69, pulse 86, temperature 97.5 F (36.4 C), temperature source Oral, resp. rate 18, height 5\' 4"  (1.626 m), weight 90.175 kg (198 lb 12.8 oz), last menstrual period 04/01/2011. Exam Physical Exam  Cardiovascular: Normal rate and regular rhythm.   Respiratory: Effort normal.    Prenatal labs: ABO, Rh:   Antibody: Negative (08/29 0000) Rubella: Immune (08/29 0000) RPR: Nonreactive (08/29 0000)  HBsAg: Negative (08/29 0000)  HIV: Non-reactive (08/29 0000)  GBS: Negative (03/11 0000)   Assessment/Plan: 26 yo with SROM at 38 2/7 weeks   Katie Murray II,Katie Murray E 12/25/2011, 7:26 AM

## 2011-12-25 NOTE — Progress Notes (Signed)
Delivery of live viable female by Dr Renaldo Fiddler, APGARS 9,9

## 2011-12-26 LAB — CBC
MCV: 88.1 fL (ref 78.0–100.0)
Platelets: 145 10*3/uL — ABNORMAL LOW (ref 150–400)
RBC: 3.7 MIL/uL — ABNORMAL LOW (ref 3.87–5.11)
RDW: 12.8 % (ref 11.5–15.5)
WBC: 11.3 10*3/uL — ABNORMAL HIGH (ref 4.0–10.5)

## 2011-12-26 MED ORDER — FENTANYL 2.5 MCG/ML BUPIVACAINE 1/10 % EPIDURAL INFUSION (WH - ANES)
INTRAMUSCULAR | Status: DC | PRN
  Administered 2011-12-25: 14 mL/h via EPIDURAL

## 2011-12-26 MED ORDER — LIDOCAINE HCL (PF) 1 % IJ SOLN
INTRAMUSCULAR | Status: DC | PRN
  Administered 2011-12-25 (×2): 4 mL

## 2011-12-26 NOTE — Progress Notes (Signed)
Post Partum Day 1 Subjective: no complaints, up ad lib, voiding, tolerating PO and + flatus  Objective: Blood pressure 111/74, pulse 72, temperature 98.1 F (36.7 C), temperature source Oral, resp. rate 18, height 5\' 4"  (1.626 m), weight 90.175 kg (198 lb 12.8 oz), last menstrual period 04/01/2011, SpO2 98.00%, unknown if currently breastfeeding.  Physical Exam:  General: alert and cooperative Lochia: appropriate Uterine Fundus: firm Incision: no significant drainage, perineum intact DVT Evaluation: No evidence of DVT seen on physical exam.   Basename 12/26/11 0540 12/25/11 0435  HGB 10.9* 11.9*  HCT 32.6* 35.2*    Assessment/Plan: Plan for discharge tomorrow   LOS: 1 day   Delta Pichon G 12/26/2011, 7:59 AM

## 2011-12-26 NOTE — Progress Notes (Signed)
UR chart review completed.  

## 2011-12-26 NOTE — Anesthesia Procedure Notes (Signed)
Epidural Patient location during procedure: OB Start time: 12/25/2011 12:11 PM  Staffing Anesthesiologist: Dante Cooter A. Performed by: anesthesiologist   Preanesthetic Checklist Completed: patient identified, site marked, surgical consent, pre-op evaluation, timeout performed, IV checked, risks and benefits discussed and monitors and equipment checked  Epidural Patient position: sitting Prep: site prepped and draped and DuraPrep Patient monitoring: continuous pulse ox and blood pressure Approach: midline Injection technique: LOR air  Needle:  Needle type: Tuohy  Needle gauge: 17 G Needle length: 9 cm Needle insertion depth: 5 cm cm Catheter type: closed end flexible Catheter size: 19 Gauge Catheter at skin depth: 10 cm Test dose: negative and Other  Assessment Events: blood not aspirated, injection not painful, no injection resistance, negative IV test and no paresthesia  Additional Notes Patient identified. Risks and benefits discussed including failed block, incomplete  Pain control, post dural puncture headache, nerve damage, paralysis, blood pressure Changes, nausea, vomiting, reactions to medications-both toxic and allergic and post Partum back pain. All questions were answered. Patient expressed understanding and wished to proceed. Sterile technique was used throughout procedure. Epidural site was Dressed with sterile barrier dressing. No paresthesias, signs of intravascular injection Or signs of intrathecal spread were encountered.  Patient was more comfortable after the epidural was dosed. Please see RN's note for documentation of vital signs and FHR which are stable.

## 2011-12-26 NOTE — Anesthesia Postprocedure Evaluation (Signed)
  Anesthesia Post-op Note  Patient: Katie Murray  Procedure(s) Performed: * No procedures listed *  Patient Location: Mother/Baby  Anesthesia Type: Epidural  Level of Consciousness: awake, alert  and oriented  Airway and Oxygen Therapy: Patient Spontanous Breathing  Post-op Pain: none  Post-op Assessment: Post-op Vital signs reviewed, Patient's Cardiovascular Status Stable, No headache, No backache, No residual numbness and No residual motor weakness  Post-op Vital Signs: Reviewed and stable  Complications: No apparent anesthesia complications

## 2011-12-27 MED ORDER — IBUPROFEN 600 MG PO TABS: 600.0000 mg | ORAL_TABLET | Freq: Four times a day (QID) | ORAL | Status: AC

## 2011-12-27 MED ORDER — PRENATAL MULTIVITAMIN CH: 1.0000 | ORAL_TABLET | Freq: Every day | ORAL | Status: DC

## 2011-12-27 NOTE — Discharge Summary (Signed)
Obstetric Discharge Summary Reason for Admission: rupture of membranes Prenatal Procedures: ultrasound Intrapartum Procedures: spontaneous vaginal delivery Postpartum Procedures: none Complications-Operative and Postpartum: none Hemoglobin  Date Value Range Status  12/26/2011 10.9* 12.0-15.0 (g/dL) Final     HCT  Date Value Range Status  12/26/2011 32.6* 36.0-46.0 (%) Final    Physical Exam:  General: alert and cooperative Lochia: appropriate Uterine Fundus: firm Incision: perineum intact DVT Evaluation: No evidence of DVT seen on physical exam.  Discharge Diagnoses: Term Pregnancy-delivered  Discharge Information: Date: 12/27/2011 Activity: pelvic rest Diet: routine Medications: PNV and Ibuprofen Condition: stable Instructions: refer to practice specific booklet Discharge to: home   Newborn Data: Live born female  Birth Weight: 6 lb 5.9 oz (2890 g) APGAR: 9, 9  Home with mother.  CURTIS,CAROL G 12/27/2011, 8:42 AM

## 2011-12-27 NOTE — Progress Notes (Signed)
Post Partum Day 2 Subjective: no complaints, up ad lib, voiding, tolerating PO and + flatus  Objective: Blood pressure 104/69, pulse 64, temperature 97.8 F (36.6 C), temperature source Oral, resp. rate 18, height 5\' 4"  (1.626 m), weight 90.175 kg (198 lb 12.8 oz), last menstrual period 04/01/2011, SpO2 99.00%, unknown if currently breastfeeding.  Physical Exam:  General: alert and cooperative Lochia: appropriate Uterine Fundus: firm Incision: perineum intact DVT Evaluation: No evidence of DVT seen on physical exam.   Basename 12/26/11 0540 12/25/11 0435  HGB 10.9* 11.9*  HCT 32.6* 35.2*    Assessment/Plan: Discharge home   LOS: 2 days   CURTIS,CAROL G 12/27/2011, 8:32 AM

## 2014-08-02 ENCOUNTER — Encounter (HOSPITAL_COMMUNITY): Payer: Self-pay

## 2014-11-10 ENCOUNTER — Other Ambulatory Visit: Payer: Self-pay | Admitting: Obstetrics and Gynecology

## 2014-11-11 LAB — CYTOLOGY - PAP

## 2017-01-14 ENCOUNTER — Emergency Department (HOSPITAL_COMMUNITY): Payer: BLUE CROSS/BLUE SHIELD

## 2017-01-14 ENCOUNTER — Encounter (HOSPITAL_COMMUNITY): Payer: Self-pay | Admitting: Emergency Medicine

## 2017-01-14 ENCOUNTER — Emergency Department (HOSPITAL_COMMUNITY)
Admission: EM | Admit: 2017-01-14 | Discharge: 2017-01-14 | Disposition: A | Discharge status: Home / Self Care | Payer: BLUE CROSS/BLUE SHIELD | Attending: Emergency Medicine | Admitting: Emergency Medicine

## 2017-01-14 DIAGNOSIS — M545 Low back pain, unspecified: Secondary | ICD-10-CM

## 2017-01-14 DIAGNOSIS — Z79899 Other long term (current) drug therapy: Secondary | ICD-10-CM | POA: Insufficient documentation

## 2017-01-14 DIAGNOSIS — R109 Unspecified abdominal pain: Secondary | ICD-10-CM | POA: Diagnosis not present

## 2017-01-14 DIAGNOSIS — M62830 Muscle spasm of back: Secondary | ICD-10-CM | POA: Diagnosis not present

## 2017-01-14 DIAGNOSIS — Z87891 Personal history of nicotine dependence: Secondary | ICD-10-CM | POA: Insufficient documentation

## 2017-01-14 LAB — URINALYSIS, ROUTINE W REFLEX MICROSCOPIC
BACTERIA UA: NONE SEEN
Bilirubin Urine: NEGATIVE
Glucose, UA: NEGATIVE mg/dL
Ketones, ur: NEGATIVE mg/dL
Leukocytes, UA: NEGATIVE
NITRITE: NEGATIVE
PH: 5 (ref 5.0–8.0)
Protein, ur: NEGATIVE mg/dL
SPECIFIC GRAVITY, URINE: 1.015 (ref 1.005–1.030)

## 2017-01-14 LAB — I-STAT CHEM 8, ED
BUN: 16 mg/dL (ref 6–20)
CHLORIDE: 105 mmol/L (ref 101–111)
CREATININE: 0.8 mg/dL (ref 0.44–1.00)
Calcium, Ion: 1.11 mmol/L — ABNORMAL LOW (ref 1.15–1.40)
Glucose, Bld: 114 mg/dL — ABNORMAL HIGH (ref 65–99)
HEMATOCRIT: 37 % (ref 36.0–46.0)
Hemoglobin: 12.6 g/dL (ref 12.0–15.0)
Potassium: 4.1 mmol/L (ref 3.5–5.1)
SODIUM: 137 mmol/L (ref 135–145)
TCO2: 24 mmol/L (ref 0–100)

## 2017-01-14 LAB — PREGNANCY, URINE: Preg Test, Ur: NEGATIVE

## 2017-01-14 MED ORDER — METHOCARBAMOL 500 MG PO TABS: 500.0000 mg | ORAL_TABLET | Freq: Two times a day (BID) | ORAL | 0 refills | Status: DC

## 2017-01-14 MED ORDER — IBUPROFEN 600 MG PO TABS: 600.0000 mg | ORAL_TABLET | Freq: Four times a day (QID) | ORAL | 0 refills | Status: DC | PRN

## 2017-01-14 MED ORDER — HYDROCODONE-ACETAMINOPHEN 5-325 MG PO TABS: 1.0000 | ORAL_TABLET | ORAL | 0 refills | Status: DC | PRN

## 2017-01-14 MED ORDER — OXYCODONE-ACETAMINOPHEN 5-325 MG PO TABS
1.0000 | ORAL_TABLET | Freq: Once | ORAL | Status: AC
  Administered 2017-01-14: 1 via ORAL
  Filled 2017-01-14: qty 1

## 2017-01-14 NOTE — ED Triage Notes (Signed)
Pt c/o severe R mid to lower back pain that started last night. She states that some positions at first made it better, but after taking a nap, the pain worsened. She denies injury, no urinary symptoms (reports that she usually doesn't have normal bathroom habits anyway) or fevers. Pt hardly able to walk on own in triage.

## 2017-01-14 NOTE — Discharge Instructions (Signed)
Follow up with your doctor if pain continues. Take medications as prescribed. Return to the emergency department if you have any worsening symptoms or new concerns.

## 2017-01-14 NOTE — ED Provider Notes (Signed)
[facility]-EMERGENCY DEPT Provider Note   CSN: [id number] Arrival date & time: 01/14/17  0123     History   Chief Complaint Chief Complaint  Patient presents with  . Back Pain    HPI Katie Murray is a 31 y.o. female.  Patient without significant medical history presents with gradual onset of right sided flank pain starting yesterday morning, worsening throughout the day. Now severe. No nausea or vomiting. No fever. She denies dysuria, hematuria, hesitancy. No cough or chest pain.   The history is provided by the patient. No language interpreter was used.  Back Pain   Pertinent negatives include no fever, no abdominal pain and no dysuria.    Past Medical History:  Diagnosis Date  . No pertinent past medical history   . Urinary tract infection     Patient Active Problem List   Diagnosis Date Noted  . Bleeding in early pregnancy 06/23/2011    Past Surgical History:  Procedure Laterality Date  . NO PAST SURGERIES      OB History    Gravida Para Term Preterm AB Living   SAB TAB Ectopic Multiple Live Births           1       Home Medications    Prior to Admission medications   Medication Sig Start Date End Date Taking? Authorizing Provider  norgestimate-ethinyl estradiol (SPRINTEC 28) 0.25-35 MG-MCG tablet Take 1 tablet by mouth daily.   Yes Historical Provider, MD  phentermine (ADIPEX-P) 37.5 MG tablet Take 18.75 mg by mouth 2 (two) times daily.   Yes Historical Provider, MD  HYDROcodone-acetaminophen (NORCO/VICODIN) 5-325 MG tablet Take 1 tablet by mouth every 4 (four) hours as needed. 01/14/17   Elpidio Anis, PA-C  ibuprofen (ADVIL,MOTRIN) 600 MG tablet Take 1 tablet (600 mg total) by mouth every 6 (six) hours as needed. 01/14/17   Elpidio Anis, PA-C  methocarbamol (ROBAXIN) 500 MG tablet Take 1 tablet (500 mg total) by mouth 2 (two) times daily. 01/14/17   Elpidio Anis, PA-C    Family History Family History  Problem Relation Age of Onset  .  Heart disease Maternal Grandfather     Social History Social History  Substance Use Topics  . Smoking status: Former Smoker    Packs/day: 0.25    Years: 2.00    Types: Cigarettes  . Smokeless tobacco: Never Used  . Alcohol use No     Allergies   Penicillins   Review of Systems Review of Systems  Constitutional: Negative for chills and fever.  HENT: Negative.   Respiratory: Negative.   Cardiovascular: Negative.   Gastrointestinal: Negative.  Negative for abdominal pain, nausea and vomiting.  Genitourinary: Negative.  Negative for difficulty urinating, dysuria and hematuria.  Musculoskeletal: Positive for back pain.  Skin: Negative.  Negative for color change.  Neurological: Negative.      Physical Exam Updated Vital Signs BP (!) 106/53   Pulse 79   Temp 97.8 F (36.6 C) (Oral)   Resp 18   Ht  (1.626 m)   Wt 99.3 kg   LMP 12/22/2016 (Exact Date)   SpO2 97%   BMI 37.59 kg/m   Physical Exam  Constitutional: She is oriented to person, place, and time. She appears well-developed and well-nourished.  Uncomfortable appearing.  Neck: Normal range of motion.  Pulmonary/Chest: Effort normal.  Abdominal: There is no tenderness.  Genitourinary:  Genitourinary Comments: No right CVA tenderness.  Musculoskeletal: Normal range of motion.       Arms: Neurological: She is alert and oriented to person, place, and time.  Skin: Skin is warm and dry.     ED Treatments / Results  Labs (all labs ordered are listed, but only abnormal results are displayed) Labs Reviewed  URINALYSIS, ROUTINE W REFLEX MICROSCOPIC - Abnormal; Notable for the following:       Result Value   APPearance HAZY (*)    Hgb urine dipstick SMALL (*)    Squamous Epithelial / LPF 0-5 (*)    All other components within normal limits  I-STAT CHEM 8, ED - Abnormal; Notable for the following:    Glucose, Bld 114 (*)    Calcium, Ion 1.11 (*)    All other components within normal limits    PREGNANCY, URINE    EKG  EKG Interpretation None       Radiology Ct Renal Stone Study  Result Date: 01/14/2017 CLINICAL DATA:  Right flank pain since yesterday. EXAM: CT ABDOMEN AND PELVIS WITHOUT CONTRAST TECHNIQUE: Multidetector CT imaging of the abdomen and pelvis was performed following the standard protocol without IV contrast. COMPARISON:  None. FINDINGS: Lower chest: Lung bases are clear. Hepatobiliary: No focal liver abnormality is seen. No gallstones, gallbladder wall thickening, or biliary dilatation. Pancreas: Unremarkable. No pancreatic ductal dilatation or surrounding inflammatory changes. Spleen: Normal in size without focal abnormality. Adrenals/Urinary Tract: 3 mm stone in the midpole left kidney. No hydronephrosis or hydroureter. No ureteral stones are seen. Bladder is decompressed. No bladder stones. No adrenal gland nodules. Stomach/Bowel: Stomach is within normal limits. Appendix appears normal. No evidence of bowel wall thickening, distention, or inflammatory changes. Vascular/Lymphatic: No significant vascular findings are present. No enlarged abdominal or pelvic lymph nodes. Reproductive: Uterus and bilateral adnexa are unremarkable. Other: No abdominal wall hernia or abnormality. No abdominopelvic ascites. Musculoskeletal: No acute or significant osseous findings. IMPRESSION: Nonobstructing stone in the midpole left kidney. No ureteral stone or obstruction identified. No acute process demonstrated on noncontrast imaging of the abdomen and pelvis. Electronically Signed   By: Burman Nieves M.D.   On: 01/14/2017 03:25    Procedures Procedures (including critical care time)  Medications Ordered in ED Medications  oxyCODONE-acetaminophen (PERCOCET/ROXICET) 5-325 MG per tablet 1 tablet (1 tablet Oral Given 01/14/17 0219)     Initial Impression / Assessment and Plan / ED Course  I have reviewed the triage vital signs and the nursing notes.  Pertinent labs & imaging  results that were available during my care of the patient were reviewed by me and considered in my medical decision making (see chart for details).     Patient given a percocet with at-rest relief of pain. She states movement continues to cause pain.   CT renal study negative for ureteral stones. Renal functions are normal. UA negative for evidence of infection. Pain is reproducible and worse with movement.  Will treat as musculoskeletal pain/spasm.   Final Clinical Impressions(s) / ED Diagnoses   Final diagnoses:  Acute right-sided low back pain without sciatica  Muscle spasm of back    New Prescriptions New Prescriptions   HYDROCODONE-ACETAMINOPHEN (NORCO/VICODIN) 5-325 MG TABLET    Take 1 tablet by mouth every 4 (four) hours as needed.   IBUPROFEN (ADVIL,MOTRIN) 600 MG TABLET    Take 1 tablet (600 mg total) by mouth every 6 (six) hours as needed.   METHOCARBAMOL (ROBAXIN) 500 MG TABLET    Take 1 tablet (500 mg total) by mouth  2 (two) times daily.     Elpidio Anis, PA-C 01/14/17 [id number]    April Palumbo, MD 01/14/17 [phone]

## 2017-01-14 NOTE — ED Notes (Signed)
Patient transported to CT 

## 2018-07-23 IMAGING — CT CT RENAL STONE PROTOCOL
2 of 4 series · 11 of 46 positions shown, 12 images · non-contrast
Comparison: None.

CLINICAL DATA: Right flank pain since yesterday.

EXAM:
CT ABDOMEN AND PELVIS WITHOUT CONTRAST
TECHNIQUE: Multidetector CT imaging of the abdomen and pelvis was performed
following the standard protocol without IV contrast.

[Series 201: stone study, idose (2) · axial · 0.71mm/px · z∈[+168,+578]mm · 8 of 100 slices shown, 9 images]
[im 9/100  soft-tissue]
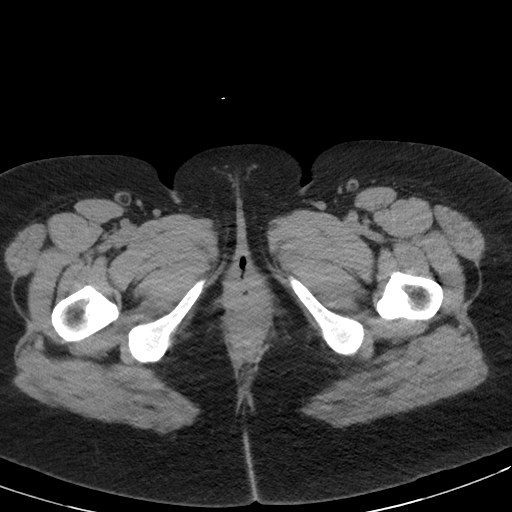
[im 9/100  bone]
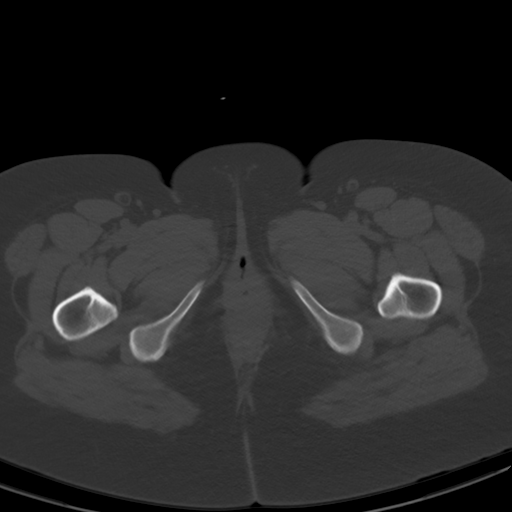
[im 21/100  soft-tissue]
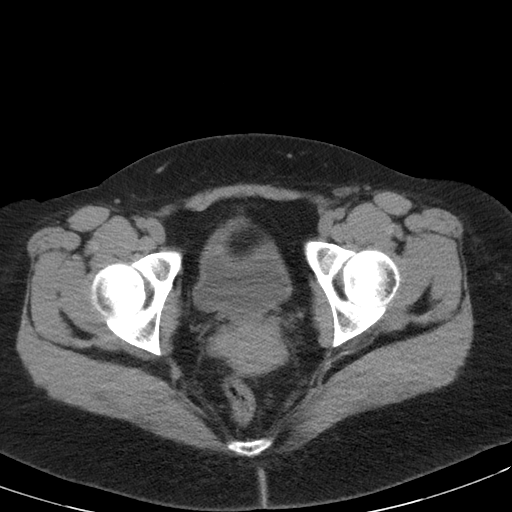
[im 34/100  soft-tissue]
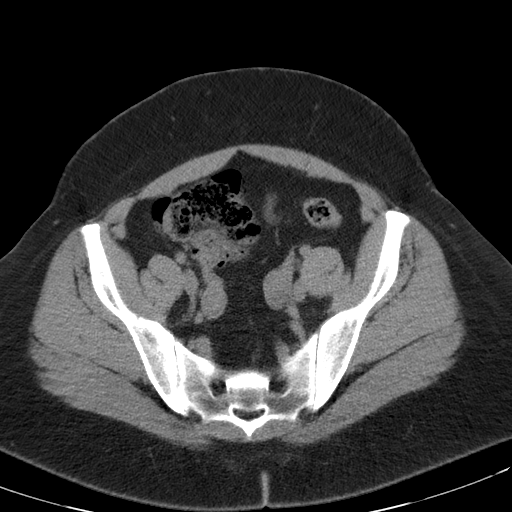
[im 46/100  soft-tissue]
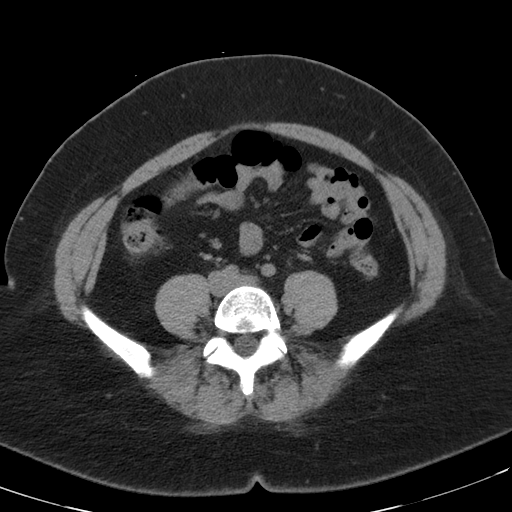
[im 54/100  soft-tissue]
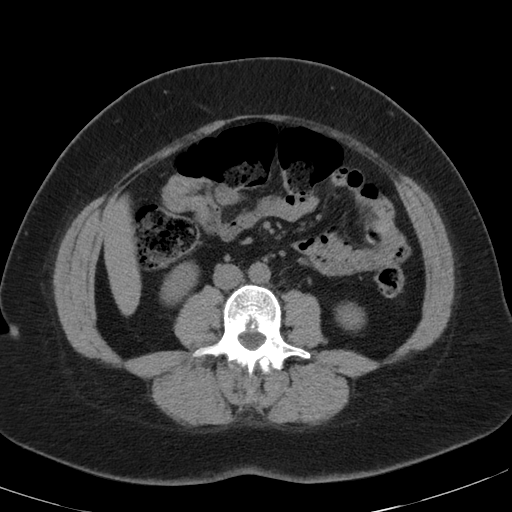
[im 67/100  soft-tissue]
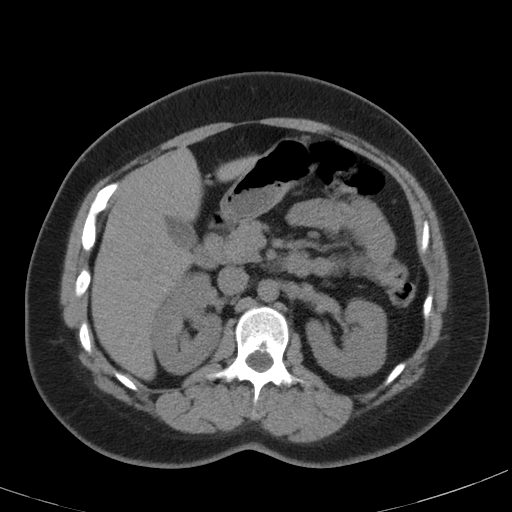
[im 79/100  soft-tissue]
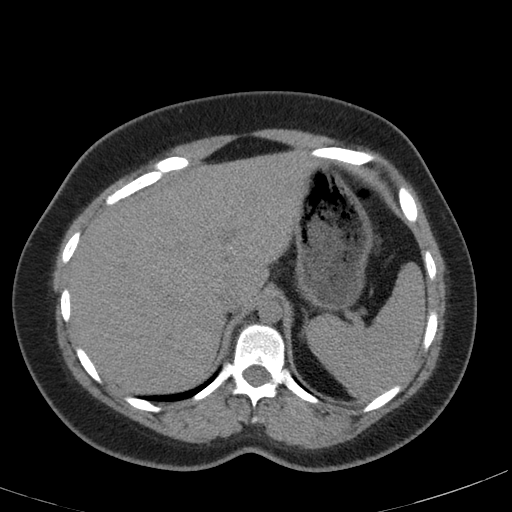
[im 91/100  soft-tissue]
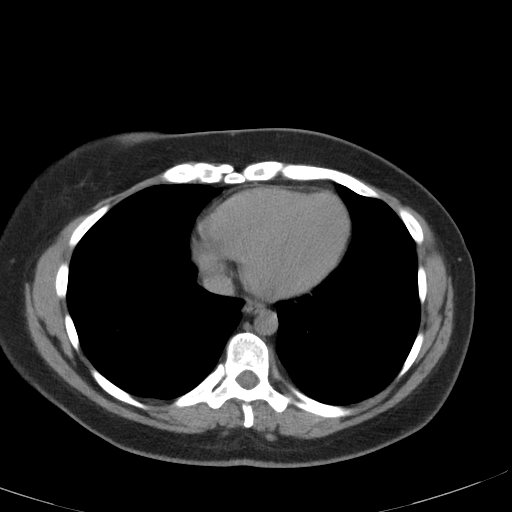

[Series 203: coronals, idose (2) · coronal · 0.45mm/px · 3 of 133 slices shown]
[im 45/133  soft-tissue]
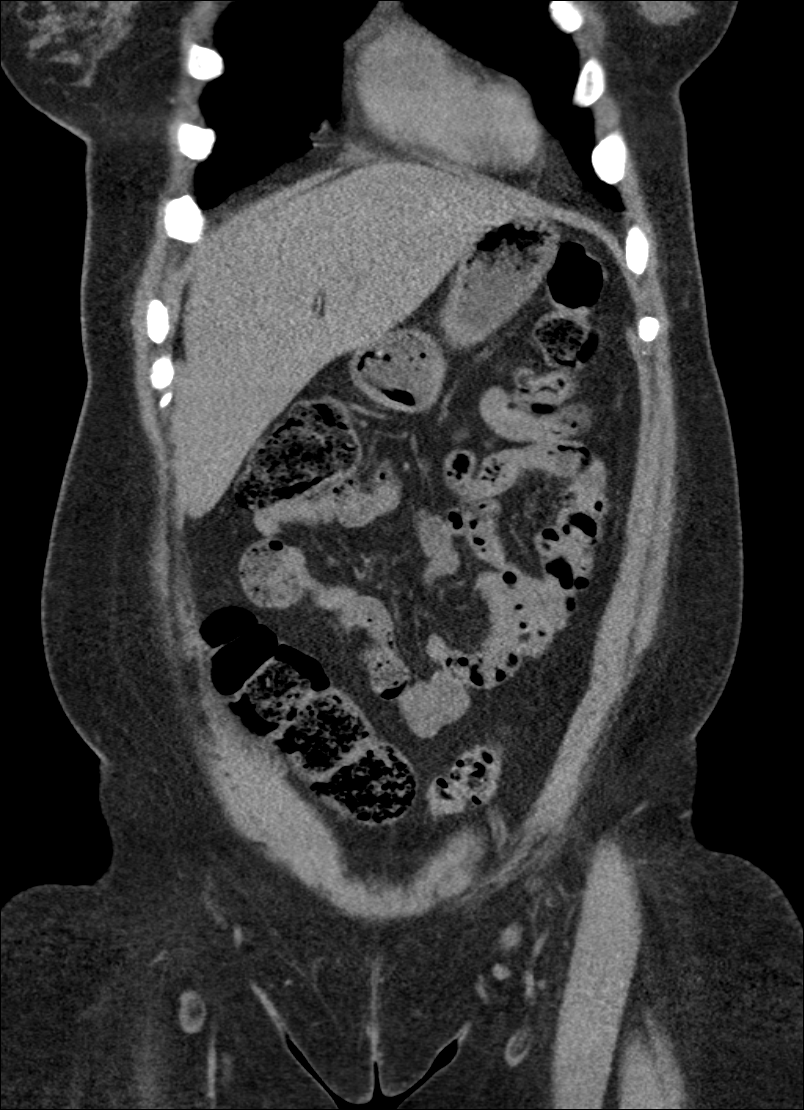
[im 59/133  soft-tissue]
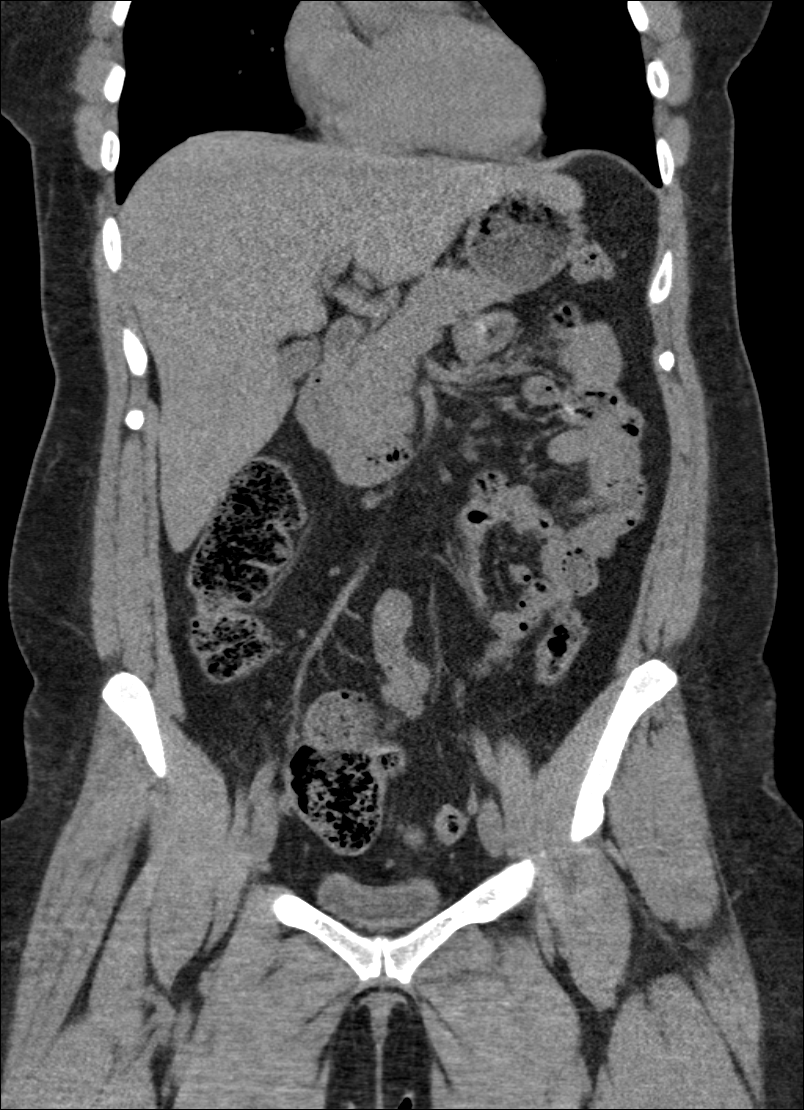
[im 74/133  soft-tissue]
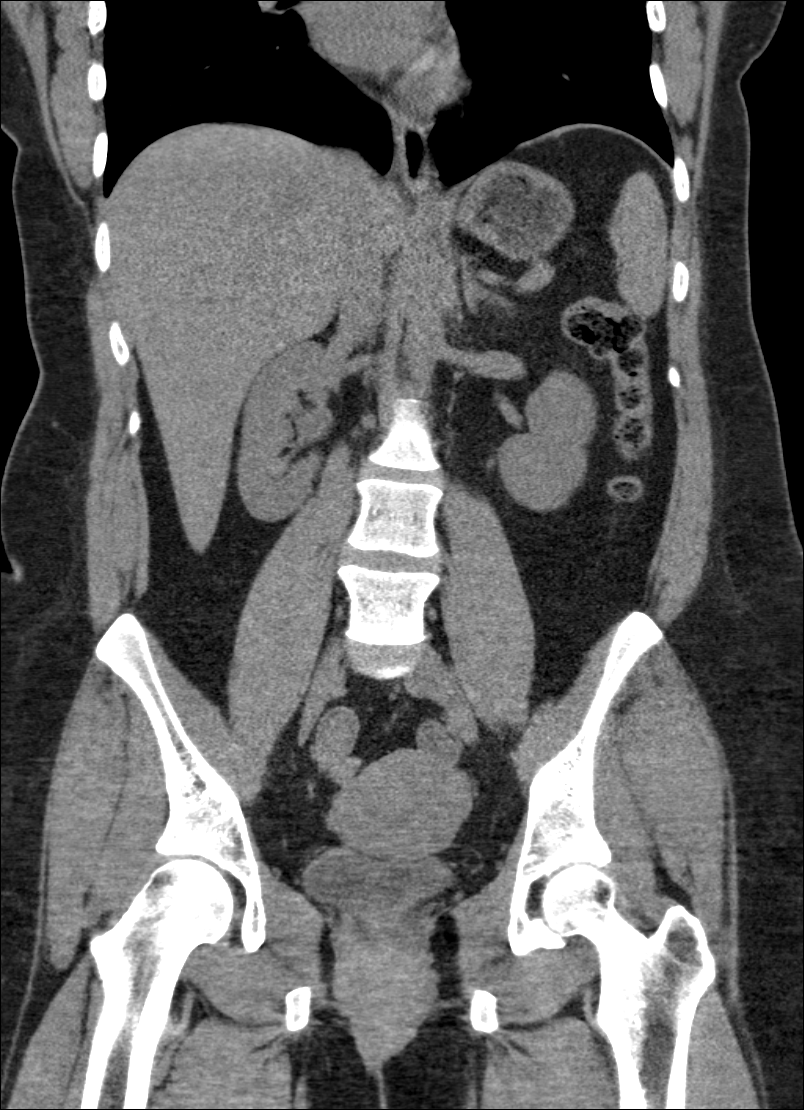

[11 of 46 positions shown; findings below may reference images not displayed]

FINDINGS: Lower chest: Lung bases are clear.

Hepatobiliary: No focal liver abnormality is seen. No gallstones,
gallbladder wall thickening, or biliary dilatation.

Pancreas: Unremarkable. No pancreatic ductal dilatation or
surrounding inflammatory changes.

Spleen: Normal in size without focal abnormality.

Adrenals/Urinary Tract: 3 mm stone in the midpole left kidney. No
hydronephrosis or hydroureter. No ureteral stones are seen. Bladder
is decompressed. No bladder stones. No adrenal gland nodules.

Stomach/Bowel: Stomach is within normal limits. Appendix appears
normal. No evidence of bowel wall thickening, distention, or
inflammatory changes.

Vascular/Lymphatic: No significant vascular findings are present. No
enlarged abdominal or pelvic lymph nodes.

Reproductive: Uterus and bilateral adnexa are unremarkable.

Other: No abdominal wall hernia or abnormality. No abdominopelvic
ascites.

Musculoskeletal: No acute or significant osseous findings.
IMPRESSION: Nonobstructing stone in the midpole left kidney. No ureteral stone
or obstruction identified. No acute process demonstrated on
noncontrast imaging of the abdomen and pelvis.

## 2020-09-20 DIAGNOSIS — D696 Thrombocytopenia, unspecified: Secondary | ICD-10-CM | POA: Diagnosis not present

## 2020-09-20 DIAGNOSIS — J90 Pleural effusion, not elsewhere classified: Secondary | ICD-10-CM | POA: Diagnosis not present

## 2020-09-20 DIAGNOSIS — U071 COVID-19: Secondary | ICD-10-CM | POA: Diagnosis not present

## 2020-09-20 DIAGNOSIS — A4189 Other specified sepsis: Secondary | ICD-10-CM | POA: Diagnosis not present

## 2020-09-20 DIAGNOSIS — R6520 Severe sepsis without septic shock: Secondary | ICD-10-CM | POA: Diagnosis not present

## 2020-09-20 DIAGNOSIS — E669 Obesity, unspecified: Secondary | ICD-10-CM | POA: Diagnosis not present

## 2020-09-20 DIAGNOSIS — J9601 Acute respiratory failure with hypoxia: Secondary | ICD-10-CM | POA: Diagnosis not present

## 2020-09-20 DIAGNOSIS — R0789 Other chest pain: Secondary | ICD-10-CM | POA: Diagnosis not present

## 2020-09-20 DIAGNOSIS — F419 Anxiety disorder, unspecified: Secondary | ICD-10-CM | POA: Diagnosis not present

## 2020-09-20 DIAGNOSIS — A419 Sepsis, unspecified organism: Secondary | ICD-10-CM | POA: Diagnosis not present

## 2020-09-20 DIAGNOSIS — M79604 Pain in right leg: Secondary | ICD-10-CM | POA: Diagnosis not present

## 2020-09-20 DIAGNOSIS — J1282 Pneumonia due to coronavirus disease 2019: Secondary | ICD-10-CM | POA: Diagnosis not present

## 2020-09-20 DIAGNOSIS — N3090 Cystitis, unspecified without hematuria: Secondary | ICD-10-CM | POA: Diagnosis not present

## 2020-09-20 DIAGNOSIS — R918 Other nonspecific abnormal finding of lung field: Secondary | ICD-10-CM | POA: Diagnosis not present

## 2020-09-20 DIAGNOSIS — R0902 Hypoxemia: Secondary | ICD-10-CM | POA: Diagnosis not present

## 2020-09-20 DIAGNOSIS — B9620 Unspecified Escherichia coli [E. coli] as the cause of diseases classified elsewhere: Secondary | ICD-10-CM | POA: Diagnosis not present

## 2020-09-20 DIAGNOSIS — Z6841 Body Mass Index (BMI) 40.0 and over, adult: Secondary | ICD-10-CM | POA: Diagnosis not present

## 2020-09-20 DIAGNOSIS — J189 Pneumonia, unspecified organism: Secondary | ICD-10-CM | POA: Diagnosis not present

## 2020-09-20 DIAGNOSIS — R0602 Shortness of breath: Secondary | ICD-10-CM | POA: Diagnosis not present

## 2020-09-20 DIAGNOSIS — Z4682 Encounter for fitting and adjustment of non-vascular catheter: Secondary | ICD-10-CM | POA: Diagnosis not present

## 2020-10-08 DIAGNOSIS — R1312 Dysphagia, oropharyngeal phase: Secondary | ICD-10-CM | POA: Diagnosis not present

## 2020-10-08 DIAGNOSIS — Z781 Physical restraint status: Secondary | ICD-10-CM | POA: Diagnosis not present

## 2020-10-08 DIAGNOSIS — R000 Tachycardia, unspecified: Secondary | ICD-10-CM | POA: Diagnosis not present

## 2020-10-08 DIAGNOSIS — Z930 Tracheostomy status: Secondary | ICD-10-CM | POA: Diagnosis not present

## 2020-10-08 DIAGNOSIS — E873 Alkalosis: Secondary | ICD-10-CM | POA: Diagnosis not present

## 2020-10-08 DIAGNOSIS — N390 Urinary tract infection, site not specified: Secondary | ICD-10-CM | POA: Diagnosis not present

## 2020-10-08 DIAGNOSIS — J9691 Respiratory failure, unspecified with hypoxia: Secondary | ICD-10-CM | POA: Diagnosis not present

## 2020-10-08 DIAGNOSIS — Z9911 Dependence on respirator [ventilator] status: Secondary | ICD-10-CM | POA: Diagnosis not present

## 2020-10-08 DIAGNOSIS — E872 Acidosis: Secondary | ICD-10-CM | POA: Diagnosis not present

## 2020-10-08 DIAGNOSIS — B9561 Methicillin susceptible Staphylococcus aureus infection as the cause of diseases classified elsewhere: Secondary | ICD-10-CM | POA: Diagnosis not present

## 2020-10-08 DIAGNOSIS — J9509 Other tracheostomy complication: Secondary | ICD-10-CM | POA: Diagnosis not present

## 2020-10-08 DIAGNOSIS — E874 Mixed disorder of acid-base balance: Secondary | ICD-10-CM | POA: Diagnosis not present

## 2020-10-08 DIAGNOSIS — R918 Other nonspecific abnormal finding of lung field: Secondary | ICD-10-CM | POA: Diagnosis not present

## 2020-10-08 DIAGNOSIS — H1132 Conjunctival hemorrhage, left eye: Secondary | ICD-10-CM | POA: Diagnosis not present

## 2020-10-08 DIAGNOSIS — J9602 Acute respiratory failure with hypercapnia: Secondary | ICD-10-CM | POA: Diagnosis not present

## 2020-10-08 DIAGNOSIS — G9341 Metabolic encephalopathy: Secondary | ICD-10-CM | POA: Diagnosis not present

## 2020-10-08 DIAGNOSIS — H11421 Conjunctival edema, right eye: Secondary | ICD-10-CM | POA: Diagnosis not present

## 2020-10-08 DIAGNOSIS — R1110 Vomiting, unspecified: Secondary | ICD-10-CM | POA: Diagnosis not present

## 2020-10-08 DIAGNOSIS — L89621 Pressure ulcer of left heel, stage 1: Secondary | ICD-10-CM | POA: Diagnosis not present

## 2020-10-08 DIAGNOSIS — R509 Fever, unspecified: Secondary | ICD-10-CM | POA: Diagnosis not present

## 2020-10-08 DIAGNOSIS — H11422 Conjunctival edema, left eye: Secondary | ICD-10-CM | POA: Diagnosis not present

## 2020-10-08 DIAGNOSIS — I517 Cardiomegaly: Secondary | ICD-10-CM | POA: Diagnosis not present

## 2020-10-08 DIAGNOSIS — R9431 Abnormal electrocardiogram [ECG] [EKG]: Secondary | ICD-10-CM | POA: Diagnosis not present

## 2020-10-08 DIAGNOSIS — E871 Hypo-osmolality and hyponatremia: Secondary | ICD-10-CM | POA: Diagnosis not present

## 2020-10-08 DIAGNOSIS — J80 Acute respiratory distress syndrome: Secondary | ICD-10-CM | POA: Diagnosis not present

## 2020-10-08 DIAGNOSIS — J811 Chronic pulmonary edema: Secondary | ICD-10-CM | POA: Diagnosis not present

## 2020-10-08 DIAGNOSIS — Z9689 Presence of other specified functional implants: Secondary | ICD-10-CM | POA: Diagnosis not present

## 2020-10-08 DIAGNOSIS — R0689 Other abnormalities of breathing: Secondary | ICD-10-CM | POA: Diagnosis not present

## 2020-10-08 DIAGNOSIS — R7881 Bacteremia: Secondary | ICD-10-CM | POA: Diagnosis not present

## 2020-10-08 DIAGNOSIS — J95851 Ventilator associated pneumonia: Secondary | ICD-10-CM | POA: Diagnosis not present

## 2020-10-08 DIAGNOSIS — Z9989 Dependence on other enabling machines and devices: Secondary | ICD-10-CM | POA: Diagnosis not present

## 2020-10-08 DIAGNOSIS — J9690 Respiratory failure, unspecified, unspecified whether with hypoxia or hypercapnia: Secondary | ICD-10-CM | POA: Diagnosis not present

## 2020-10-08 DIAGNOSIS — Z4659 Encounter for fitting and adjustment of other gastrointestinal appliance and device: Secondary | ICD-10-CM | POA: Diagnosis not present

## 2020-10-08 DIAGNOSIS — J15211 Pneumonia due to Methicillin susceptible Staphylococcus aureus: Secondary | ICD-10-CM | POA: Diagnosis not present

## 2020-10-08 DIAGNOSIS — Z515 Encounter for palliative care: Secondary | ICD-10-CM | POA: Diagnosis not present

## 2020-10-08 DIAGNOSIS — D62 Acute posthemorrhagic anemia: Secondary | ICD-10-CM | POA: Diagnosis not present

## 2020-10-08 DIAGNOSIS — E870 Hyperosmolality and hypernatremia: Secondary | ICD-10-CM | POA: Diagnosis not present

## 2020-10-08 DIAGNOSIS — R0902 Hypoxemia: Secondary | ICD-10-CM | POA: Diagnosis not present

## 2020-10-08 DIAGNOSIS — J984 Other disorders of lung: Secondary | ICD-10-CM | POA: Diagnosis not present

## 2020-10-08 DIAGNOSIS — J1282 Pneumonia due to coronavirus disease 2019: Secondary | ICD-10-CM | POA: Diagnosis not present

## 2020-10-08 DIAGNOSIS — U071 COVID-19: Secondary | ICD-10-CM | POA: Diagnosis not present

## 2020-10-08 DIAGNOSIS — J9601 Acute respiratory failure with hypoxia: Secondary | ICD-10-CM | POA: Diagnosis not present

## 2020-10-08 DIAGNOSIS — R451 Restlessness and agitation: Secondary | ICD-10-CM | POA: Diagnosis not present

## 2020-10-08 DIAGNOSIS — R6521 Severe sepsis with septic shock: Secondary | ICD-10-CM | POA: Diagnosis not present

## 2020-10-08 DIAGNOSIS — J9382 Other air leak: Secondary | ICD-10-CM | POA: Diagnosis not present

## 2020-10-08 DIAGNOSIS — A4189 Other specified sepsis: Secondary | ICD-10-CM | POA: Diagnosis not present

## 2020-10-08 DIAGNOSIS — Z9281 Personal history of extracorporeal membrane oxygenation (ECMO): Secondary | ICD-10-CM | POA: Diagnosis not present

## 2020-11-24 DIAGNOSIS — E669 Obesity, unspecified: Secondary | ICD-10-CM | POA: Diagnosis not present

## 2020-11-24 DIAGNOSIS — L89891 Pressure ulcer of other site, stage 1: Secondary | ICD-10-CM | POA: Diagnosis not present

## 2020-11-24 DIAGNOSIS — U099 Post covid-19 condition, unspecified: Secondary | ICD-10-CM | POA: Diagnosis not present

## 2020-11-24 DIAGNOSIS — Z959 Presence of cardiac and vascular implant and graft, unspecified: Secondary | ICD-10-CM | POA: Diagnosis not present

## 2020-11-24 DIAGNOSIS — J1281 Pneumonia due to SARS-associated coronavirus: Secondary | ICD-10-CM | POA: Diagnosis not present

## 2020-11-24 DIAGNOSIS — Z9911 Dependence on respirator [ventilator] status: Secondary | ICD-10-CM | POA: Diagnosis not present

## 2020-11-24 DIAGNOSIS — F1120 Opioid dependence, uncomplicated: Secondary | ICD-10-CM | POA: Diagnosis not present

## 2020-11-24 DIAGNOSIS — U071 COVID-19: Secondary | ICD-10-CM | POA: Diagnosis not present

## 2020-11-24 DIAGNOSIS — R1310 Dysphagia, unspecified: Secondary | ICD-10-CM | POA: Diagnosis not present

## 2020-11-24 DIAGNOSIS — Z9989 Dependence on other enabling machines and devices: Secondary | ICD-10-CM | POA: Diagnosis not present

## 2020-11-24 DIAGNOSIS — E871 Hypo-osmolality and hyponatremia: Secondary | ICD-10-CM | POA: Diagnosis not present

## 2020-11-24 DIAGNOSIS — J9690 Respiratory failure, unspecified, unspecified whether with hypoxia or hypercapnia: Secondary | ICD-10-CM | POA: Diagnosis not present

## 2020-11-24 DIAGNOSIS — R112 Nausea with vomiting, unspecified: Secondary | ICD-10-CM | POA: Diagnosis not present

## 2020-11-24 DIAGNOSIS — J15211 Pneumonia due to Methicillin susceptible Staphylococcus aureus: Secondary | ICD-10-CM | POA: Diagnosis not present

## 2020-11-24 DIAGNOSIS — J9621 Acute and chronic respiratory failure with hypoxia: Secondary | ICD-10-CM | POA: Diagnosis not present

## 2020-11-24 DIAGNOSIS — J80 Acute respiratory distress syndrome: Secondary | ICD-10-CM | POA: Diagnosis not present

## 2020-11-24 DIAGNOSIS — J9601 Acute respiratory failure with hypoxia: Secondary | ICD-10-CM | POA: Diagnosis not present

## 2020-11-24 DIAGNOSIS — J690 Pneumonitis due to inhalation of food and vomit: Secondary | ICD-10-CM | POA: Diagnosis not present

## 2020-11-24 DIAGNOSIS — J9610 Chronic respiratory failure, unspecified whether with hypoxia or hypercapnia: Secondary | ICD-10-CM | POA: Diagnosis not present

## 2020-11-24 DIAGNOSIS — R1312 Dysphagia, oropharyngeal phase: Secondary | ICD-10-CM | POA: Diagnosis not present

## 2020-11-24 DIAGNOSIS — J398 Other specified diseases of upper respiratory tract: Secondary | ICD-10-CM | POA: Diagnosis not present

## 2020-11-24 DIAGNOSIS — R54 Age-related physical debility: Secondary | ICD-10-CM | POA: Diagnosis not present

## 2020-11-24 DIAGNOSIS — F439 Reaction to severe stress, unspecified: Secondary | ICD-10-CM | POA: Diagnosis not present

## 2020-11-24 DIAGNOSIS — L89626 Pressure-induced deep tissue damage of left heel: Secondary | ICD-10-CM | POA: Diagnosis not present

## 2020-11-24 DIAGNOSIS — I5030 Unspecified diastolic (congestive) heart failure: Secondary | ICD-10-CM | POA: Diagnosis not present

## 2020-11-24 DIAGNOSIS — J156 Pneumonia due to other aerobic Gram-negative bacteria: Secondary | ICD-10-CM | POA: Diagnosis not present

## 2020-11-24 DIAGNOSIS — R5381 Other malaise: Secondary | ICD-10-CM | POA: Diagnosis not present

## 2020-11-24 DIAGNOSIS — G8929 Other chronic pain: Secondary | ICD-10-CM | POA: Diagnosis not present

## 2020-11-24 DIAGNOSIS — Z9981 Dependence on supplemental oxygen: Secondary | ICD-10-CM | POA: Diagnosis not present

## 2020-11-24 DIAGNOSIS — I5031 Acute diastolic (congestive) heart failure: Secondary | ICD-10-CM | POA: Diagnosis not present

## 2020-11-24 DIAGNOSIS — Z8616 Personal history of COVID-19: Secondary | ICD-10-CM | POA: Diagnosis not present

## 2020-11-24 DIAGNOSIS — G894 Chronic pain syndrome: Secondary | ICD-10-CM | POA: Diagnosis not present

## 2020-11-26 DIAGNOSIS — J690 Pneumonitis due to inhalation of food and vomit: Secondary | ICD-10-CM | POA: Diagnosis not present

## 2020-11-26 DIAGNOSIS — J9621 Acute and chronic respiratory failure with hypoxia: Secondary | ICD-10-CM

## 2020-11-26 DIAGNOSIS — U071 COVID-19: Secondary | ICD-10-CM

## 2020-11-26 DIAGNOSIS — J80 Acute respiratory distress syndrome: Secondary | ICD-10-CM | POA: Diagnosis not present

## 2020-11-27 DIAGNOSIS — U071 COVID-19: Secondary | ICD-10-CM

## 2020-11-27 DIAGNOSIS — J80 Acute respiratory distress syndrome: Secondary | ICD-10-CM | POA: Diagnosis not present

## 2020-11-27 DIAGNOSIS — J690 Pneumonitis due to inhalation of food and vomit: Secondary | ICD-10-CM

## 2020-11-27 DIAGNOSIS — J9621 Acute and chronic respiratory failure with hypoxia: Secondary | ICD-10-CM

## 2020-11-28 DIAGNOSIS — J690 Pneumonitis due to inhalation of food and vomit: Secondary | ICD-10-CM

## 2020-11-28 DIAGNOSIS — U071 COVID-19: Secondary | ICD-10-CM

## 2020-11-28 DIAGNOSIS — J80 Acute respiratory distress syndrome: Secondary | ICD-10-CM

## 2020-11-28 DIAGNOSIS — J9621 Acute and chronic respiratory failure with hypoxia: Secondary | ICD-10-CM | POA: Diagnosis not present

## 2020-11-29 DIAGNOSIS — U071 COVID-19: Secondary | ICD-10-CM | POA: Diagnosis not present

## 2020-11-29 DIAGNOSIS — J9621 Acute and chronic respiratory failure with hypoxia: Secondary | ICD-10-CM

## 2020-11-29 DIAGNOSIS — J80 Acute respiratory distress syndrome: Secondary | ICD-10-CM | POA: Diagnosis not present

## 2020-11-29 DIAGNOSIS — J690 Pneumonitis due to inhalation of food and vomit: Secondary | ICD-10-CM

## 2020-11-30 DIAGNOSIS — J80 Acute respiratory distress syndrome: Secondary | ICD-10-CM | POA: Diagnosis not present

## 2020-11-30 DIAGNOSIS — J690 Pneumonitis due to inhalation of food and vomit: Secondary | ICD-10-CM | POA: Diagnosis not present

## 2020-11-30 DIAGNOSIS — U071 COVID-19: Secondary | ICD-10-CM | POA: Diagnosis not present

## 2020-11-30 DIAGNOSIS — J9621 Acute and chronic respiratory failure with hypoxia: Secondary | ICD-10-CM | POA: Diagnosis not present

## 2020-12-01 DIAGNOSIS — J80 Acute respiratory distress syndrome: Secondary | ICD-10-CM | POA: Diagnosis not present

## 2020-12-01 DIAGNOSIS — J690 Pneumonitis due to inhalation of food and vomit: Secondary | ICD-10-CM

## 2020-12-01 DIAGNOSIS — U071 COVID-19: Secondary | ICD-10-CM | POA: Diagnosis not present

## 2020-12-01 DIAGNOSIS — J9621 Acute and chronic respiratory failure with hypoxia: Secondary | ICD-10-CM | POA: Diagnosis not present

## 2020-12-02 DIAGNOSIS — U071 COVID-19: Secondary | ICD-10-CM | POA: Diagnosis not present

## 2020-12-02 DIAGNOSIS — J690 Pneumonitis due to inhalation of food and vomit: Secondary | ICD-10-CM | POA: Diagnosis not present

## 2020-12-02 DIAGNOSIS — J80 Acute respiratory distress syndrome: Secondary | ICD-10-CM

## 2020-12-02 DIAGNOSIS — J9621 Acute and chronic respiratory failure with hypoxia: Secondary | ICD-10-CM | POA: Diagnosis not present

## 2020-12-03 DIAGNOSIS — J690 Pneumonitis due to inhalation of food and vomit: Secondary | ICD-10-CM | POA: Diagnosis not present

## 2020-12-03 DIAGNOSIS — J9621 Acute and chronic respiratory failure with hypoxia: Secondary | ICD-10-CM | POA: Diagnosis not present

## 2020-12-03 DIAGNOSIS — U071 COVID-19: Secondary | ICD-10-CM

## 2020-12-03 DIAGNOSIS — J80 Acute respiratory distress syndrome: Secondary | ICD-10-CM | POA: Diagnosis not present

## 2020-12-04 DIAGNOSIS — J80 Acute respiratory distress syndrome: Secondary | ICD-10-CM | POA: Diagnosis not present

## 2020-12-04 DIAGNOSIS — J690 Pneumonitis due to inhalation of food and vomit: Secondary | ICD-10-CM | POA: Diagnosis not present

## 2020-12-04 DIAGNOSIS — J9621 Acute and chronic respiratory failure with hypoxia: Secondary | ICD-10-CM

## 2020-12-04 DIAGNOSIS — U071 COVID-19: Secondary | ICD-10-CM

## 2020-12-08 DIAGNOSIS — E872 Acidosis: Secondary | ICD-10-CM | POA: Diagnosis not present

## 2020-12-08 DIAGNOSIS — J9621 Acute and chronic respiratory failure with hypoxia: Secondary | ICD-10-CM | POA: Diagnosis not present

## 2020-12-08 DIAGNOSIS — F419 Anxiety disorder, unspecified: Secondary | ICD-10-CM | POA: Diagnosis not present

## 2020-12-08 DIAGNOSIS — F32A Depression, unspecified: Secondary | ICD-10-CM | POA: Diagnosis not present

## 2020-12-08 DIAGNOSIS — J9601 Acute respiratory failure with hypoxia: Secondary | ICD-10-CM | POA: Diagnosis not present

## 2020-12-08 DIAGNOSIS — Z9981 Dependence on supplemental oxygen: Secondary | ICD-10-CM | POA: Diagnosis not present

## 2020-12-08 DIAGNOSIS — J1282 Pneumonia due to coronavirus disease 2019: Secondary | ICD-10-CM | POA: Diagnosis not present

## 2020-12-08 DIAGNOSIS — I517 Cardiomegaly: Secondary | ICD-10-CM | POA: Diagnosis not present

## 2020-12-08 DIAGNOSIS — R1310 Dysphagia, unspecified: Secondary | ICD-10-CM | POA: Diagnosis not present

## 2020-12-08 DIAGNOSIS — R439 Unspecified disturbances of smell and taste: Secondary | ICD-10-CM | POA: Diagnosis not present

## 2020-12-08 DIAGNOSIS — U071 COVID-19: Secondary | ICD-10-CM | POA: Diagnosis not present

## 2020-12-08 DIAGNOSIS — L659 Nonscarring hair loss, unspecified: Secondary | ICD-10-CM | POA: Diagnosis not present

## 2020-12-08 DIAGNOSIS — Z7401 Bed confinement status: Secondary | ICD-10-CM | POA: Diagnosis not present

## 2020-12-08 DIAGNOSIS — R52 Pain, unspecified: Secondary | ICD-10-CM | POA: Diagnosis not present

## 2020-12-08 DIAGNOSIS — R5381 Other malaise: Secondary | ICD-10-CM | POA: Diagnosis not present

## 2020-12-08 DIAGNOSIS — G7281 Critical illness myopathy: Secondary | ICD-10-CM | POA: Diagnosis not present

## 2020-12-08 DIAGNOSIS — K5900 Constipation, unspecified: Secondary | ICD-10-CM | POA: Diagnosis not present

## 2020-12-08 DIAGNOSIS — M2550 Pain in unspecified joint: Secondary | ICD-10-CM | POA: Diagnosis not present

## 2020-12-22 DIAGNOSIS — F418 Other specified anxiety disorders: Secondary | ICD-10-CM | POA: Diagnosis not present

## 2020-12-22 DIAGNOSIS — Z131 Encounter for screening for diabetes mellitus: Secondary | ICD-10-CM | POA: Diagnosis not present

## 2020-12-22 DIAGNOSIS — Z0001 Encounter for general adult medical examination with abnormal findings: Secondary | ICD-10-CM | POA: Diagnosis not present

## 2020-12-22 DIAGNOSIS — G7281 Critical illness myopathy: Secondary | ICD-10-CM | POA: Diagnosis not present

## 2020-12-22 DIAGNOSIS — R5383 Other fatigue: Secondary | ICD-10-CM | POA: Diagnosis not present

## 2020-12-22 DIAGNOSIS — I5032 Chronic diastolic (congestive) heart failure: Secondary | ICD-10-CM | POA: Diagnosis not present

## 2020-12-22 DIAGNOSIS — Z136 Encounter for screening for cardiovascular disorders: Secondary | ICD-10-CM | POA: Diagnosis not present

## 2020-12-22 DIAGNOSIS — J1282 Pneumonia due to coronavirus disease 2019: Secondary | ICD-10-CM | POA: Diagnosis not present

## 2020-12-22 DIAGNOSIS — Z1329 Encounter for screening for other suspected endocrine disorder: Secondary | ICD-10-CM | POA: Diagnosis not present

## 2020-12-22 DIAGNOSIS — U071 COVID-19: Secondary | ICD-10-CM | POA: Diagnosis not present

## 2020-12-22 DIAGNOSIS — J9601 Acute respiratory failure with hypoxia: Secondary | ICD-10-CM | POA: Diagnosis not present

## 2021-01-02 DIAGNOSIS — R531 Weakness: Secondary | ICD-10-CM | POA: Diagnosis not present

## 2021-01-02 DIAGNOSIS — R2990 Unspecified symptoms and signs involving the nervous system: Secondary | ICD-10-CM | POA: Diagnosis not present

## 2021-01-02 DIAGNOSIS — G988 Other disorders of nervous system: Secondary | ICD-10-CM | POA: Diagnosis not present

## 2021-01-06 NOTE — Progress Notes (Signed)
Date:  01/10/2021   ID:  Katie Murray, DOB 1985-12-12, MRN [id number]  PCP:  Jackie Plum, MD  Cardiologist: Tessa Lerner, DO, [facility] (established care 01/09/2021)  REASON FOR CONSULT: Chronic diastolic congestive heart failure  REQUESTING PHYSICIAN:  Jackie Plum, MD [address] [place],  [place] [address]  Chief Complaint  Patient presents with  . Chronic diastolic (congestive) heart failure  . New Patient (Initial Visit)    Referred by Jackie Plum, MD    HPI  Katie Murray is a 35 y.o. female used to work as a Holiday representative for [employer] presents to the office with a chief complaint of " evaluate for congestive heart failure." Patient's past medical history and cardiovascular risk factors include: COVID-19 infection in December 2021 requiring mechanical ventilation and ECMO support, status post tracheostomy and decannulation, history of Acetobacter and MSSA during recent hospitalization respiratory cultures.  She is referred to the office at the request of Osei-Bonsu, Greggory Stallion, MD for evaluation of chronic diastolic congestive heart failure.  In December 2021 patient had COVID-19 infection which led to acute respiratory failure and subsequently had septic shock along with ARDS.  She required mechanical ventilation due to progressive deterioration was transferred to [facility] for ECMO support.  She was on ECMO during her hospitalization at [facility] and based on the limited records available ECMO was decannulated around November 10, 2020.  Due to the prolonged hospitalization and vent support patient underwent tracheostomy on 10/25/2020.  She was then transferred to [facility] given her underlying tracheostomy and later her trach was decannulated and she was transferred to [facility] inpatient rehab.  She now presents to our office at the request of her primary care provider for evaluation  of congestive heart failure.  I do not have any records to review at today's office visit with regards to her prolonged hospitalization due to COVID-19 infection.  Patient denies any formal diagnosis of congestive heart failure.  Patient is complaining of shortness of breath which is chronic and stable and most likely the sequelae of her COVID-19 infection, status post mechanical ventilation, tracheostomy, and ECMO support.  Patient states that his shortness of breath is worse with effort related activities and at times also present at rest during the evening hours.  Shortness of breath resolves with rest.  She denies orthopnea, paroxysmal nocturnal dyspnea or lower extremity swelling.  Patient states that she is having lightheaded and dizziness which is being attributed to vertigo and has started physical therapy for it under the care of her PCP.   Despite her recent COVID-19 infection, requiring mechanical ventilation support/tracheostomy and ECMO support patient still is against being vaccinated for COVID-19.  FUNCTIONAL STATUS: Not very active since her recently prolonged hospitalization. Ambulates w/ cane.    ALLERGIES: Allergies  Allergen Reactions  . Penicillins Hives    MEDICATION LIST PRIOR TO VISIT: Current Meds  Medication Sig  . ALPRAZolam (XANAX) 0.25 MG tablet Take 0.25 mg by mouth 3 (three) times daily.  Marland Kitchen BREO ELLIPTA 100-25 MCG/INH AEPB Inhale 1 puff into the lungs daily.  . famotidine (PEPCID) 20 MG tablet Take 20 mg by mouth 2 (two) times daily.  . fluticasone (FLONASE) 50 MCG/ACT nasal spray Place 2 sprays into both nostrils daily.  Marland Kitchen ketoconazole (NIZORAL) 2 % shampoo Apply topically daily as needed.  . metoprolol succinate (TOPROL XL) 25 MG 24 hr tablet Take 1 tablet (25 mg total) by mouth  daily.  . Multiple Vitamin (MULTI-VITAMIN) tablet Take 1 tablet by mouth daily.  Marland Kitchen. zinc sulfate 220 (50 Zn) MG capsule Take 220 mg by mouth daily.     PAST MEDICAL HISTORY: Past  Medical History:  Diagnosis Date  . History of COVID-19   . No pertinent past medical history   . Urinary tract infection     PAST SURGICAL HISTORY: Past Surgical History:  Procedure Laterality Date  . ECMO CANNULATION     And Decannulation   . NO PAST SURGERIES    . TRACHEOSTOMY    . TRACHEOSTOMY CLOSURE      FAMILY HISTORY: The patient family history includes Heart disease in her maternal grandfather; Skin cancer in her brother.  SOCIAL HISTORY:  The patient  reports that she has quit smoking. Her smoking use included cigarettes. She has a 0.50 pack-year smoking history. She has never used smokeless tobacco. She reports that she does not drink alcohol and does not use drugs.  REVIEW OF SYSTEMS: Review of Systems  Constitutional: Negative for chills and fever.  HENT: Negative for hoarse voice and nosebleeds.   Eyes: Negative for discharge, double vision and pain.  Cardiovascular: Negative for chest pain, claudication, dyspnea on exertion, leg swelling, near-syncope, orthopnea, palpitations and paroxysmal nocturnal dyspnea.  Respiratory: Positive for shortness of breath. Negative for hemoptysis.   Musculoskeletal: Negative for muscle cramps and myalgias.  Gastrointestinal: Negative for abdominal pain, constipation, diarrhea, hematemesis, hematochezia, melena, nausea and vomiting.  Neurological: Positive for dizziness and light-headedness.  All other systems reviewed and are negative.   PHYSICAL EXAM: Vitals with BMI 01/09/2021 01/14/2017 01/14/2017  Height 5\' 4"  - -  Weight 217 lbs 14 oz - -  BMI 37.38 - -  Systolic 118 106 93  Diastolic 81 53 72  Pulse 99 79 74    CONSTITUTIONAL: Appears older than stated age, hemodynamically stable, ambulates with a cane, no acute distress.  SKIN: Skin is warm and dry. No rash noted. No cyanosis. No pallor. No jaundice HEAD: Normocephalic and atraumatic.  EYES: No scleral icterus MOUTH/THROAT: Moist oral membranes.  NECK: No JVD  present. No thyromegaly noted. No carotid bruits  LYMPHATIC: No visible cervical adenopathy.  CHEST Normal respiratory effort. No intercostal retractions  LUNGS: Clear to auscultation bilaterally.  No stridor. No wheezes. No rales.  CARDIOVASCULAR: Tachycardia and regular rhythm rhythm, positive S1-S2, no murmurs rubs or gallops appreciated. ABDOMINAL: Obese, soft, nontender, nondistended, positive bowel sounds in all 4 quadrants no apparent ascites.  EXTREMITIES: Prior ECMO site well-healed, no peripheral edema  HEMATOLOGIC: No significant bruising NEUROLOGIC: Oriented to person, place, and time. Nonfocal. Normal muscle tone.  PSYCHIATRIC: Normal mood and affect. Normal behavior. Cooperative  CARDIAC DATABASE: EKG: 01/09/2021: Sinus tachycardia, 103 bpm, normal axis, right atrial enlargement, without underlying injury pattern.    [facility]: The left ventricular size is normal. There is normal left ventricular wall thickness. Left ventricular systolic function is normal. LV ejection fraction = 60-65%. Left ventricular filling pattern is indeterminate. The right ventricular systolic function is normal. There is no significant valvular stenosis or regurgitation. No valvular vegetations or intracardiac masses were seen, though pulmonic valve not well visualized. If high concern for endocarditis remains, consider TEE if indicated. The IVC is normal in size with an inspiratory collapse of greater then 50%, suggesting normal right atrial pressure. There is no pericardial effusion. Since the last study dated 11/01/20, the patient has been decannulated off ECMO. Difficult comparison, but RV  size, function, and pressures have all likely improved.  Stress Testing: No results found for this or any previous visit from the past 1095 days.  Heart Catheterization: None   LABORATORY DATA: CBC Latest Ref Rng & Units 01/14/2017 12/26/2011 12/25/2011   WBC 4.0 - 10.5 K/uL - 11.3(H) 11.1(H)  Hemoglobin 12.0 - 15.0 g/dL 16.0 10.9(L) 11.9(L)  Hematocrit 36.0 - 46.0 % 37.0 32.6(L) 35.2(L)  Platelets 150 - 400 K/uL - 145(L) 157    CMP Latest Ref Rng & Units 01/14/2017 12/06/2011 06/24/2011  Glucose 65 - 99 mg/dL 737(T) 062(I) 948(N)  BUN 6 - 20 mg/dL 16 12 6   Creatinine 0.44 - 1.00 mg/dL 4.62 7.03  Sodium 135 - 145 mmol/L 137 133(L) 135  Potassium 3.5 - 5.1 mmol/L 4.1 3.8 3.7  Chloride 101 - 111 mmol/L 105 99 102  CO2 19 - 32 mEq/L - 21 23  Calcium 8.4 - 10.5 mg/dL - 9.0 9.3  Total Protein 6.0 - 8.3 g/dL - 6.1 6.7  Total Bilirubin 0.3 - 1.2 mg/dL - 0.7 0.3  Alkaline Phos 39 - 117 U/L - 147(H) 55  AST 0 - 37 U/L - 29 19  ALT 0 - 35 U/L - 33 13    Lipid Panel  No results found for: CHOL, TRIG, HDL, CHOLHDL, VLDL, LDLCALC, LDLDIRECT, LABVLDL  No components found for: NTPROBNP No results for input(s): PROBNP in the last 8760 hours. No results for input(s): TSH in the last 8760 hours.  BMP No results for input(s): NA, K, CL, CO2, GLUCOSE, BUN, CREATININE, CALCIUM, GFRNONAA, GFRAA in the last 8760 hours.  HEMOGLOBIN A1C No results found for: HGBA1C, MPG  IMPRESSION:    ICD-10-CM   1. Shortness of breath  R06.02 Basic metabolic panel    Magnesium    Pro b natriuretic peptide (BNP)    Lipid Panel With LDL/HDL Ratio    LDL cholesterol, direct    Hemoglobin and hematocrit, blood    ECHOCARDIOGRAM COMPLETE    metoprolol succinate (TOPROL XL) 25 MG 24 hr tablet  2. History of COVID-19  Z86.16   3. H/O tracheostomy  Z98.890 EKG 12-Lead  4. Personal history of extracorporeal membrane oxygenation (ECMO)  Z92.81   5. Former smoker  Z87.891   6. Class 2 severe obesity due to excess calories with serious comorbidity and body mass index (BMI) of 37.0 to 37.9 in adult [facility])  E66.01    Z68.37      RECOMMENDATIONS: Katie Murray is a 35 y.o. female whose past medical history and cardiac risk factors include:  COVID-19 infection in  December 2021 requiring mechanical ventilation and ECMO support, status post tracheostomy and decannulation, history of Acetobacter and MSSA during recent hospitalization respiratory cultures.  Shortness of breath: I suspect is most likely secondary to her recent COVID-19 infection with prolonged hospitalization state requiring mechanical ventilation and tracheostomy support. Currently she is euvolemic and not in florid heart failure. She had an echocardiogram that I was able to locate from care everywhere in February 2022 which notes preserved LVEF. Will check BMP, magnesium level, BNP.  History of COVID-19 infection: Initially diagnosed in December 2021 which led to septic shock/ARDS requiring mechanical support which later was exchanged for tracheostomy tube and transferred to [facility] for ECMO support. Despite a very lengthy and complex hospitalization given the course of events patient still is against being vaccinated for COVID-19 infection.    Former smoker: Educated on the importance of continued smoking cessation.  FINAL MEDICATION  LIST END OF ENCOUNTER: Meds ordered this encounter  Medications  . metoprolol succinate (TOPROL XL) 25 MG 24 hr tablet    Sig: Take 1 tablet (25 mg total) by mouth daily.    Dispense:  30 tablet    Refill:  0    Medications Discontinued During This Encounter  Medication Reason  . HYDROcodone-acetaminophen (NORCO/VICODIN) 5-325 MG tablet Error  . ibuprofen (ADVIL,MOTRIN) 600 MG tablet Error  . methocarbamol (ROBAXIN) 500 MG tablet Error  . norgestimate-ethinyl estradiol (SPRINTEC 28) 0.25-35 MG-MCG tablet Error  . phentermine (ADIPEX-P) 37.5 MG tablet Error     Current Outpatient Medications:  .  ALPRAZolam (XANAX) 0.25 MG tablet, Take 0.25 mg by mouth 3 (three) times daily., Disp: , Rfl:  .  BREO ELLIPTA 100-25 MCG/INH AEPB, Inhale 1 puff into the lungs daily., Disp: , Rfl:  .  famotidine (PEPCID) 20 MG tablet, Take 20 mg by mouth 2 (two)  times daily., Disp: , Rfl:  .  fluticasone (FLONASE) 50 MCG/ACT nasal spray, Place 2 sprays into both nostrils daily., Disp: , Rfl:  .  ketoconazole (NIZORAL) 2 % shampoo, Apply topically daily as needed., Disp: , Rfl:  .  metoprolol succinate (TOPROL XL) 25 MG 24 hr tablet, Take 1 tablet (25 mg total) by mouth daily., Disp: 30 tablet, Rfl: 0 .  Multiple Vitamin (MULTI-VITAMIN) tablet, Take 1 tablet by mouth daily., Disp: , Rfl:  .  zinc sulfate 220 (50 Zn) MG capsule, Take 220 mg by mouth daily., Disp: , Rfl:   Orders Placed This Encounter  Procedures  . Basic metabolic panel  . Magnesium  . Pro b natriuretic peptide (BNP)  . Lipid Panel With LDL/HDL Ratio  . LDL cholesterol, direct  . Hemoglobin and hematocrit, blood  . EKG 12-Lead  . ECHOCARDIOGRAM COMPLETE    There are no Patient Instructions on file for this visit.   --Continue cardiac medications as reconciled in final medication list. --Return in about 4 weeks (around 02/06/2021) for Follow up, Dyspnea. Or sooner if needed. --Continue follow-up with your primary care physician regarding the management of your other chronic comorbid conditions.  Patient's questions and concerns were addressed to her satisfaction. She voices understanding of the instructions provided during this encounter.   This note was created using a voice recognition software as a result there may be grammatical errors inadvertently enclosed that do not reflect the nature of this encounter. Every attempt is made to correct such errors.  Tessa Lerner, [place], [facility]  Pager: [phone] Office: [phone]

## 2021-01-09 ENCOUNTER — Other Ambulatory Visit: Payer: Self-pay

## 2021-01-09 ENCOUNTER — Encounter: Payer: Self-pay | Admitting: Cardiology

## 2021-01-09 ENCOUNTER — Ambulatory Visit: Payer: BC Managed Care – PPO | Admitting: Cardiology

## 2021-01-09 VITALS — BP 118/81 | HR 99 | Temp 98.3°F | Resp 16 | Ht 64.0 in | Wt 217.9 lb

## 2021-01-09 DIAGNOSIS — R2990 Unspecified symptoms and signs involving the nervous system: Secondary | ICD-10-CM | POA: Diagnosis not present

## 2021-01-09 DIAGNOSIS — Z9889 Other specified postprocedural states: Secondary | ICD-10-CM

## 2021-01-09 DIAGNOSIS — R0602 Shortness of breath: Secondary | ICD-10-CM

## 2021-01-09 DIAGNOSIS — Z8616 Personal history of COVID-19: Secondary | ICD-10-CM

## 2021-01-09 DIAGNOSIS — R531 Weakness: Secondary | ICD-10-CM | POA: Diagnosis not present

## 2021-01-09 DIAGNOSIS — G988 Other disorders of nervous system: Secondary | ICD-10-CM | POA: Diagnosis not present

## 2021-01-09 DIAGNOSIS — Z9281 Personal history of extracorporeal membrane oxygenation (ECMO): Secondary | ICD-10-CM

## 2021-01-09 DIAGNOSIS — Z87891 Personal history of nicotine dependence: Secondary | ICD-10-CM

## 2021-01-09 DIAGNOSIS — Z6837 Body mass index (BMI) 37.0-37.9, adult: Secondary | ICD-10-CM

## 2021-01-09 MED ORDER — METOPROLOL SUCCINATE ER 25 MG PO TB24: 25.0000 mg | ORAL_TABLET | Freq: Every day | ORAL | 0 refills | Status: DC

## 2021-01-11 DIAGNOSIS — R531 Weakness: Secondary | ICD-10-CM | POA: Diagnosis not present

## 2021-01-11 DIAGNOSIS — R2990 Unspecified symptoms and signs involving the nervous system: Secondary | ICD-10-CM | POA: Diagnosis not present

## 2021-01-11 DIAGNOSIS — G988 Other disorders of nervous system: Secondary | ICD-10-CM | POA: Diagnosis not present

## 2021-01-12 DIAGNOSIS — U071 COVID-19: Secondary | ICD-10-CM | POA: Diagnosis not present

## 2021-01-12 DIAGNOSIS — J1282 Pneumonia due to coronavirus disease 2019: Secondary | ICD-10-CM | POA: Diagnosis not present

## 2021-01-12 DIAGNOSIS — F418 Other specified anxiety disorders: Secondary | ICD-10-CM | POA: Diagnosis not present

## 2021-01-12 DIAGNOSIS — I5032 Chronic diastolic (congestive) heart failure: Secondary | ICD-10-CM | POA: Diagnosis not present

## 2021-01-16 DIAGNOSIS — R531 Weakness: Secondary | ICD-10-CM | POA: Diagnosis not present

## 2021-01-16 DIAGNOSIS — G988 Other disorders of nervous system: Secondary | ICD-10-CM | POA: Diagnosis not present

## 2021-01-16 DIAGNOSIS — R2990 Unspecified symptoms and signs involving the nervous system: Secondary | ICD-10-CM | POA: Diagnosis not present

## 2021-01-18 DIAGNOSIS — R531 Weakness: Secondary | ICD-10-CM | POA: Diagnosis not present

## 2021-01-18 DIAGNOSIS — G988 Other disorders of nervous system: Secondary | ICD-10-CM | POA: Diagnosis not present

## 2021-01-18 DIAGNOSIS — R2990 Unspecified symptoms and signs involving the nervous system: Secondary | ICD-10-CM | POA: Diagnosis not present

## 2021-01-26 DIAGNOSIS — R531 Weakness: Secondary | ICD-10-CM | POA: Diagnosis not present

## 2021-01-26 DIAGNOSIS — R2990 Unspecified symptoms and signs involving the nervous system: Secondary | ICD-10-CM | POA: Diagnosis not present

## 2021-01-26 DIAGNOSIS — G988 Other disorders of nervous system: Secondary | ICD-10-CM | POA: Diagnosis not present

## 2021-01-29 ENCOUNTER — Other Ambulatory Visit: Payer: Self-pay | Admitting: Cardiology

## 2021-01-29 DIAGNOSIS — R0602 Shortness of breath: Secondary | ICD-10-CM

## 2021-01-30 DIAGNOSIS — R42 Dizziness and giddiness: Secondary | ICD-10-CM | POA: Diagnosis not present

## 2021-01-30 DIAGNOSIS — Z09 Encounter for follow-up examination after completed treatment for conditions other than malignant neoplasm: Secondary | ICD-10-CM | POA: Diagnosis not present

## 2021-01-30 DIAGNOSIS — Z8616 Personal history of COVID-19: Secondary | ICD-10-CM | POA: Diagnosis not present

## 2021-01-31 DIAGNOSIS — J9601 Acute respiratory failure with hypoxia: Secondary | ICD-10-CM | POA: Diagnosis not present

## 2021-01-31 DIAGNOSIS — R6889 Other general symptoms and signs: Secondary | ICD-10-CM | POA: Diagnosis not present

## 2021-01-31 DIAGNOSIS — U071 COVID-19: Secondary | ICD-10-CM | POA: Diagnosis not present

## 2021-01-31 DIAGNOSIS — J1282 Pneumonia due to coronavirus disease 2019: Secondary | ICD-10-CM | POA: Diagnosis not present

## 2021-02-01 DIAGNOSIS — Z8616 Personal history of COVID-19: Secondary | ICD-10-CM | POA: Diagnosis not present

## 2021-02-01 DIAGNOSIS — Z09 Encounter for follow-up examination after completed treatment for conditions other than malignant neoplasm: Secondary | ICD-10-CM | POA: Diagnosis not present

## 2021-02-01 DIAGNOSIS — R42 Dizziness and giddiness: Secondary | ICD-10-CM | POA: Diagnosis not present

## 2021-02-07 DIAGNOSIS — Z09 Encounter for follow-up examination after completed treatment for conditions other than malignant neoplasm: Secondary | ICD-10-CM | POA: Diagnosis not present

## 2021-02-07 DIAGNOSIS — Z8616 Personal history of COVID-19: Secondary | ICD-10-CM | POA: Diagnosis not present

## 2021-02-07 DIAGNOSIS — R42 Dizziness and giddiness: Secondary | ICD-10-CM | POA: Diagnosis not present

## 2021-02-10 DIAGNOSIS — R42 Dizziness and giddiness: Secondary | ICD-10-CM | POA: Diagnosis not present

## 2021-02-10 DIAGNOSIS — Z09 Encounter for follow-up examination after completed treatment for conditions other than malignant neoplasm: Secondary | ICD-10-CM | POA: Diagnosis not present

## 2021-02-10 DIAGNOSIS — Z8616 Personal history of COVID-19: Secondary | ICD-10-CM | POA: Diagnosis not present

## 2021-02-20 ENCOUNTER — Other Ambulatory Visit: Payer: Self-pay

## 2021-02-20 DIAGNOSIS — R42 Dizziness and giddiness: Secondary | ICD-10-CM | POA: Diagnosis not present

## 2021-02-20 DIAGNOSIS — R0602 Shortness of breath: Secondary | ICD-10-CM

## 2021-02-20 DIAGNOSIS — Z09 Encounter for follow-up examination after completed treatment for conditions other than malignant neoplasm: Secondary | ICD-10-CM | POA: Diagnosis not present

## 2021-02-20 DIAGNOSIS — Z8616 Personal history of COVID-19: Secondary | ICD-10-CM | POA: Diagnosis not present

## 2021-02-20 MED ORDER — METOPROLOL SUCCINATE ER 25 MG PO TB24: 25.0000 mg | ORAL_TABLET | Freq: Every day | ORAL | 0 refills | Status: DC

## 2021-02-22 DIAGNOSIS — Z09 Encounter for follow-up examination after completed treatment for conditions other than malignant neoplasm: Secondary | ICD-10-CM | POA: Diagnosis not present

## 2021-02-22 DIAGNOSIS — Z8616 Personal history of COVID-19: Secondary | ICD-10-CM | POA: Diagnosis not present

## 2021-02-22 DIAGNOSIS — R42 Dizziness and giddiness: Secondary | ICD-10-CM | POA: Diagnosis not present

## 2021-02-23 ENCOUNTER — Other Ambulatory Visit: Payer: Self-pay | Admitting: Cardiology

## 2021-02-23 DIAGNOSIS — R0602 Shortness of breath: Secondary | ICD-10-CM

## 2021-02-28 DIAGNOSIS — Z8616 Personal history of COVID-19: Secondary | ICD-10-CM | POA: Diagnosis not present

## 2021-02-28 DIAGNOSIS — R42 Dizziness and giddiness: Secondary | ICD-10-CM | POA: Diagnosis not present

## 2021-02-28 DIAGNOSIS — Z09 Encounter for follow-up examination after completed treatment for conditions other than malignant neoplasm: Secondary | ICD-10-CM | POA: Diagnosis not present

## 2021-03-02 ENCOUNTER — Ambulatory Visit: Payer: BC Managed Care – PPO | Admitting: Cardiology

## 2021-03-06 DIAGNOSIS — Z9181 History of falling: Secondary | ICD-10-CM | POA: Diagnosis not present

## 2021-03-06 DIAGNOSIS — R2689 Other abnormalities of gait and mobility: Secondary | ICD-10-CM | POA: Diagnosis not present

## 2021-03-06 DIAGNOSIS — M6281 Muscle weakness (generalized): Secondary | ICD-10-CM | POA: Diagnosis not present

## 2021-03-06 DIAGNOSIS — G988 Other disorders of nervous system: Secondary | ICD-10-CM | POA: Diagnosis not present

## 2021-03-08 DIAGNOSIS — M6281 Muscle weakness (generalized): Secondary | ICD-10-CM | POA: Diagnosis not present

## 2021-03-08 DIAGNOSIS — R2689 Other abnormalities of gait and mobility: Secondary | ICD-10-CM | POA: Diagnosis not present

## 2021-03-08 DIAGNOSIS — Z9181 History of falling: Secondary | ICD-10-CM | POA: Diagnosis not present

## 2021-03-08 DIAGNOSIS — G988 Other disorders of nervous system: Secondary | ICD-10-CM | POA: Diagnosis not present

## 2021-03-10 ENCOUNTER — Other Ambulatory Visit: Payer: Self-pay | Admitting: Cardiology

## 2021-03-10 DIAGNOSIS — R0602 Shortness of breath: Secondary | ICD-10-CM

## 2021-03-13 DIAGNOSIS — R062 Wheezing: Secondary | ICD-10-CM | POA: Diagnosis not present

## 2021-03-13 DIAGNOSIS — R0602 Shortness of breath: Secondary | ICD-10-CM | POA: Diagnosis not present

## 2021-03-15 DIAGNOSIS — Z9181 History of falling: Secondary | ICD-10-CM | POA: Diagnosis not present

## 2021-03-15 DIAGNOSIS — R2689 Other abnormalities of gait and mobility: Secondary | ICD-10-CM | POA: Diagnosis not present

## 2021-03-15 DIAGNOSIS — G988 Other disorders of nervous system: Secondary | ICD-10-CM | POA: Diagnosis not present

## 2021-03-15 DIAGNOSIS — M6281 Muscle weakness (generalized): Secondary | ICD-10-CM | POA: Diagnosis not present

## 2021-03-17 DIAGNOSIS — Z9181 History of falling: Secondary | ICD-10-CM | POA: Diagnosis not present

## 2021-03-17 DIAGNOSIS — M6281 Muscle weakness (generalized): Secondary | ICD-10-CM | POA: Diagnosis not present

## 2021-03-17 DIAGNOSIS — R2689 Other abnormalities of gait and mobility: Secondary | ICD-10-CM | POA: Diagnosis not present

## 2021-03-17 DIAGNOSIS — G988 Other disorders of nervous system: Secondary | ICD-10-CM | POA: Diagnosis not present

## 2021-03-22 ENCOUNTER — Other Ambulatory Visit: Payer: Self-pay | Admitting: Cardiology

## 2021-03-22 DIAGNOSIS — R0602 Shortness of breath: Secondary | ICD-10-CM

## 2021-03-29 DIAGNOSIS — F418 Other specified anxiety disorders: Secondary | ICD-10-CM | POA: Diagnosis not present

## 2021-03-29 DIAGNOSIS — I5032 Chronic diastolic (congestive) heart failure: Secondary | ICD-10-CM | POA: Diagnosis not present

## 2021-04-04 ENCOUNTER — Other Ambulatory Visit: Payer: Self-pay

## 2021-04-04 ENCOUNTER — Ambulatory Visit (HOSPITAL_COMMUNITY)
Admission: RE | Admit: 2021-04-04 | Discharge: 2021-04-04 | Disposition: A | Discharge status: Home / Self Care | Payer: BC Managed Care – PPO | Source: Ambulatory Visit | Attending: Cardiology | Admitting: Cardiology

## 2021-04-04 DIAGNOSIS — Z8616 Personal history of COVID-19: Secondary | ICD-10-CM | POA: Insufficient documentation

## 2021-04-04 DIAGNOSIS — R0602 Shortness of breath: Secondary | ICD-10-CM | POA: Diagnosis not present

## 2021-04-04 NOTE — Progress Notes (Signed)
  Echocardiogram 2D Echocardiogram has been performed.  Katie Murray F 04/04/2021, 11:05 AM

## 2021-04-07 DIAGNOSIS — Z9281 Personal history of extracorporeal membrane oxygenation (ECMO): Secondary | ICD-10-CM | POA: Insufficient documentation

## 2021-04-07 DIAGNOSIS — R0602 Shortness of breath: Secondary | ICD-10-CM | POA: Insufficient documentation

## 2021-04-07 DIAGNOSIS — Z8616 Personal history of COVID-19: Secondary | ICD-10-CM | POA: Insufficient documentation

## 2021-04-07 LAB — ECHOCARDIOGRAM COMPLETE
AV Mean grad: 5 mmHg
AV Peak grad: 9.1 mmHg
Ao pk vel: 1.51 m/s
Area-P 1/2: 4.21 cm2
S' Lateral: 2.9 cm

## 2021-04-11 NOTE — Progress Notes (Signed)
Called pt to inform her about her echo results

## 2021-06-19 DIAGNOSIS — U099 Post covid-19 condition, unspecified: Secondary | ICD-10-CM | POA: Diagnosis not present

## 2021-06-19 DIAGNOSIS — R6889 Other general symptoms and signs: Secondary | ICD-10-CM | POA: Diagnosis not present

## 2021-06-19 DIAGNOSIS — F419 Anxiety disorder, unspecified: Secondary | ICD-10-CM | POA: Diagnosis not present

## 2021-06-19 DIAGNOSIS — R0600 Dyspnea, unspecified: Secondary | ICD-10-CM | POA: Diagnosis not present

## 2021-07-18 DIAGNOSIS — F419 Anxiety disorder, unspecified: Secondary | ICD-10-CM | POA: Diagnosis not present

## 2021-07-18 DIAGNOSIS — J984 Other disorders of lung: Secondary | ICD-10-CM | POA: Diagnosis not present

## 2021-07-18 DIAGNOSIS — U099 Post covid-19 condition, unspecified: Secondary | ICD-10-CM | POA: Diagnosis not present

## 2021-09-20 DIAGNOSIS — U099 Post covid-19 condition, unspecified: Secondary | ICD-10-CM | POA: Diagnosis not present

## 2021-09-20 DIAGNOSIS — R531 Weakness: Secondary | ICD-10-CM | POA: Diagnosis not present

## 2021-09-20 DIAGNOSIS — R5383 Other fatigue: Secondary | ICD-10-CM | POA: Diagnosis not present

## 2021-09-20 DIAGNOSIS — J189 Pneumonia, unspecified organism: Secondary | ICD-10-CM | POA: Diagnosis not present

## 2021-09-20 DIAGNOSIS — R0600 Dyspnea, unspecified: Secondary | ICD-10-CM | POA: Diagnosis not present

## 2021-09-21 DIAGNOSIS — J984 Other disorders of lung: Secondary | ICD-10-CM | POA: Diagnosis not present

## 2021-09-21 DIAGNOSIS — R6889 Other general symptoms and signs: Secondary | ICD-10-CM | POA: Diagnosis not present

## 2021-09-21 DIAGNOSIS — R0609 Other forms of dyspnea: Secondary | ICD-10-CM | POA: Diagnosis not present

## 2021-09-21 DIAGNOSIS — U099 Post covid-19 condition, unspecified: Secondary | ICD-10-CM | POA: Diagnosis not present

## 2021-10-25 DIAGNOSIS — D225 Melanocytic nevi of trunk: Secondary | ICD-10-CM | POA: Diagnosis not present

## 2021-10-25 DIAGNOSIS — Z01419 Encounter for gynecological examination (general) (routine) without abnormal findings: Secondary | ICD-10-CM | POA: Diagnosis not present

## 2021-10-25 DIAGNOSIS — Z1151 Encounter for screening for human papillomavirus (HPV): Secondary | ICD-10-CM | POA: Diagnosis not present

## 2021-10-25 DIAGNOSIS — Z8709 Personal history of other diseases of the respiratory system: Secondary | ICD-10-CM | POA: Diagnosis not present

## 2021-10-25 DIAGNOSIS — Z01411 Encounter for gynecological examination (general) (routine) with abnormal findings: Secondary | ICD-10-CM | POA: Diagnosis not present

## 2021-10-25 DIAGNOSIS — Z808 Family history of malignant neoplasm of other organs or systems: Secondary | ICD-10-CM | POA: Diagnosis not present

## 2021-10-25 DIAGNOSIS — Z8616 Personal history of COVID-19: Secondary | ICD-10-CM | POA: Diagnosis not present

## 2021-11-15 DIAGNOSIS — J984 Other disorders of lung: Secondary | ICD-10-CM | POA: Diagnosis not present

## 2021-11-22 DIAGNOSIS — R053 Chronic cough: Secondary | ICD-10-CM | POA: Diagnosis not present

## 2021-11-22 DIAGNOSIS — U099 Post covid-19 condition, unspecified: Secondary | ICD-10-CM | POA: Diagnosis not present

## 2021-11-22 DIAGNOSIS — J984 Other disorders of lung: Secondary | ICD-10-CM | POA: Diagnosis not present

## 2021-11-22 DIAGNOSIS — F4310 Post-traumatic stress disorder, unspecified: Secondary | ICD-10-CM | POA: Diagnosis not present

## 2021-11-23 DIAGNOSIS — U099 Post covid-19 condition, unspecified: Secondary | ICD-10-CM | POA: Diagnosis not present

## 2021-11-23 DIAGNOSIS — J479 Bronchiectasis, uncomplicated: Secondary | ICD-10-CM | POA: Diagnosis not present

## 2021-11-23 DIAGNOSIS — R918 Other nonspecific abnormal finding of lung field: Secondary | ICD-10-CM | POA: Diagnosis not present

## 2021-11-23 DIAGNOSIS — J189 Pneumonia, unspecified organism: Secondary | ICD-10-CM | POA: Diagnosis not present

## 2021-11-23 DIAGNOSIS — Z930 Tracheostomy status: Secondary | ICD-10-CM | POA: Diagnosis not present

## 2022-03-15 DIAGNOSIS — N393 Stress incontinence (female) (male): Secondary | ICD-10-CM | POA: Diagnosis not present

## 2022-03-21 DIAGNOSIS — Z6841 Body Mass Index (BMI) 40.0 and over, adult: Secondary | ICD-10-CM | POA: Diagnosis not present

## 2022-03-21 DIAGNOSIS — R490 Dysphonia: Secondary | ICD-10-CM | POA: Diagnosis not present

## 2022-03-21 DIAGNOSIS — N393 Stress incontinence (female) (male): Secondary | ICD-10-CM | POA: Diagnosis not present

## 2022-03-21 DIAGNOSIS — Z9889 Other specified postprocedural states: Secondary | ICD-10-CM | POA: Diagnosis not present

## 2022-04-16 DIAGNOSIS — N393 Stress incontinence (female) (male): Secondary | ICD-10-CM | POA: Diagnosis not present

## 2022-04-20 DIAGNOSIS — R9431 Abnormal electrocardiogram [ECG] [EKG]: Secondary | ICD-10-CM | POA: Diagnosis not present

## 2022-04-20 DIAGNOSIS — Z01812 Encounter for preprocedural laboratory examination: Secondary | ICD-10-CM | POA: Diagnosis not present

## 2022-04-20 DIAGNOSIS — Z01810 Encounter for preprocedural cardiovascular examination: Secondary | ICD-10-CM | POA: Diagnosis not present

## 2022-04-20 DIAGNOSIS — N393 Stress incontinence (female) (male): Secondary | ICD-10-CM | POA: Diagnosis not present

## 2022-04-23 DIAGNOSIS — R9431 Abnormal electrocardiogram [ECG] [EKG]: Secondary | ICD-10-CM | POA: Diagnosis not present

## 2022-05-01 DIAGNOSIS — Z8616 Personal history of COVID-19: Secondary | ICD-10-CM | POA: Diagnosis not present

## 2022-05-01 DIAGNOSIS — R0602 Shortness of breath: Secondary | ICD-10-CM | POA: Diagnosis not present

## 2022-05-01 DIAGNOSIS — I509 Heart failure, unspecified: Secondary | ICD-10-CM | POA: Diagnosis not present

## 2022-05-01 DIAGNOSIS — N393 Stress incontinence (female) (male): Secondary | ICD-10-CM | POA: Diagnosis not present

## 2022-05-04 DIAGNOSIS — L821 Other seborrheic keratosis: Secondary | ICD-10-CM | POA: Diagnosis not present

## 2022-05-04 DIAGNOSIS — D1801 Hemangioma of skin and subcutaneous tissue: Secondary | ICD-10-CM | POA: Diagnosis not present

## 2022-05-14 DIAGNOSIS — Z9889 Other specified postprocedural states: Secondary | ICD-10-CM | POA: Diagnosis not present

## 2022-05-14 DIAGNOSIS — N393 Stress incontinence (female) (male): Secondary | ICD-10-CM | POA: Diagnosis not present

## 2022-05-17 DIAGNOSIS — Z9889 Other specified postprocedural states: Secondary | ICD-10-CM | POA: Diagnosis not present

## 2022-05-17 DIAGNOSIS — R490 Dysphonia: Secondary | ICD-10-CM | POA: Diagnosis not present

## 2022-05-17 DIAGNOSIS — Z87891 Personal history of nicotine dependence: Secondary | ICD-10-CM | POA: Diagnosis not present

## 2022-05-17 DIAGNOSIS — R0600 Dyspnea, unspecified: Secondary | ICD-10-CM | POA: Diagnosis not present

## 2022-05-17 DIAGNOSIS — J385 Laryngeal spasm: Secondary | ICD-10-CM | POA: Diagnosis not present

## 2022-05-17 DIAGNOSIS — Z930 Tracheostomy status: Secondary | ICD-10-CM | POA: Diagnosis not present

## 2022-05-23 DIAGNOSIS — J984 Other disorders of lung: Secondary | ICD-10-CM | POA: Diagnosis not present

## 2022-05-23 DIAGNOSIS — R0609 Other forms of dyspnea: Secondary | ICD-10-CM | POA: Diagnosis not present

## 2022-05-23 DIAGNOSIS — U099 Post covid-19 condition, unspecified: Secondary | ICD-10-CM | POA: Diagnosis not present

## 2022-06-14 DIAGNOSIS — N393 Stress incontinence (female) (male): Secondary | ICD-10-CM | POA: Diagnosis not present

## 2022-06-14 DIAGNOSIS — Z9889 Other specified postprocedural states: Secondary | ICD-10-CM | POA: Diagnosis not present

## 2022-07-02 DIAGNOSIS — R490 Dysphonia: Secondary | ICD-10-CM | POA: Diagnosis not present

## 2022-07-02 DIAGNOSIS — J385 Laryngeal spasm: Secondary | ICD-10-CM | POA: Diagnosis not present

## 2022-07-05 DIAGNOSIS — N393 Stress incontinence (female) (male): Secondary | ICD-10-CM | POA: Diagnosis not present

## 2022-07-05 DIAGNOSIS — M6289 Other specified disorders of muscle: Secondary | ICD-10-CM | POA: Diagnosis not present

## 2022-07-05 DIAGNOSIS — M6281 Muscle weakness (generalized): Secondary | ICD-10-CM | POA: Diagnosis not present

## 2022-07-10 DIAGNOSIS — R490 Dysphonia: Secondary | ICD-10-CM | POA: Diagnosis not present

## 2022-07-10 DIAGNOSIS — J385 Laryngeal spasm: Secondary | ICD-10-CM | POA: Diagnosis not present

## 2022-07-12 DIAGNOSIS — M6289 Other specified disorders of muscle: Secondary | ICD-10-CM | POA: Diagnosis not present

## 2022-07-12 DIAGNOSIS — M6281 Muscle weakness (generalized): Secondary | ICD-10-CM | POA: Diagnosis not present

## 2022-07-12 DIAGNOSIS — N393 Stress incontinence (female) (male): Secondary | ICD-10-CM | POA: Diagnosis not present

## 2022-07-17 DIAGNOSIS — J385 Laryngeal spasm: Secondary | ICD-10-CM | POA: Diagnosis not present

## 2022-07-17 DIAGNOSIS — R490 Dysphonia: Secondary | ICD-10-CM | POA: Diagnosis not present

## 2022-07-18 DIAGNOSIS — N393 Stress incontinence (female) (male): Secondary | ICD-10-CM | POA: Diagnosis not present

## 2022-07-18 DIAGNOSIS — M6289 Other specified disorders of muscle: Secondary | ICD-10-CM | POA: Diagnosis not present

## 2022-07-18 DIAGNOSIS — M6281 Muscle weakness (generalized): Secondary | ICD-10-CM | POA: Diagnosis not present

## 2022-07-27 DIAGNOSIS — M6289 Other specified disorders of muscle: Secondary | ICD-10-CM | POA: Diagnosis not present

## 2022-07-27 DIAGNOSIS — M6281 Muscle weakness (generalized): Secondary | ICD-10-CM | POA: Diagnosis not present

## 2022-07-27 DIAGNOSIS — N393 Stress incontinence (female) (male): Secondary | ICD-10-CM | POA: Diagnosis not present

## 2022-08-03 DIAGNOSIS — M6281 Muscle weakness (generalized): Secondary | ICD-10-CM | POA: Diagnosis not present

## 2022-08-03 DIAGNOSIS — M6289 Other specified disorders of muscle: Secondary | ICD-10-CM | POA: Diagnosis not present

## 2022-08-03 DIAGNOSIS — N393 Stress incontinence (female) (male): Secondary | ICD-10-CM | POA: Diagnosis not present

## 2022-08-10 DIAGNOSIS — M6289 Other specified disorders of muscle: Secondary | ICD-10-CM | POA: Diagnosis not present

## 2022-08-10 DIAGNOSIS — M6281 Muscle weakness (generalized): Secondary | ICD-10-CM | POA: Diagnosis not present

## 2022-08-10 DIAGNOSIS — N393 Stress incontinence (female) (male): Secondary | ICD-10-CM | POA: Diagnosis not present

## 2022-08-17 DIAGNOSIS — M6289 Other specified disorders of muscle: Secondary | ICD-10-CM | POA: Diagnosis not present

## 2022-08-17 DIAGNOSIS — M6281 Muscle weakness (generalized): Secondary | ICD-10-CM | POA: Diagnosis not present

## 2022-08-17 DIAGNOSIS — N393 Stress incontinence (female) (male): Secondary | ICD-10-CM | POA: Diagnosis not present

## 2022-08-21 DIAGNOSIS — M6281 Muscle weakness (generalized): Secondary | ICD-10-CM | POA: Diagnosis not present

## 2022-08-21 DIAGNOSIS — M6289 Other specified disorders of muscle: Secondary | ICD-10-CM | POA: Diagnosis not present

## 2022-08-21 DIAGNOSIS — N393 Stress incontinence (female) (male): Secondary | ICD-10-CM | POA: Diagnosis not present

## 2022-09-07 DIAGNOSIS — N393 Stress incontinence (female) (male): Secondary | ICD-10-CM | POA: Diagnosis not present

## 2022-09-07 DIAGNOSIS — M6281 Muscle weakness (generalized): Secondary | ICD-10-CM | POA: Diagnosis not present

## 2022-09-07 DIAGNOSIS — M6289 Other specified disorders of muscle: Secondary | ICD-10-CM | POA: Diagnosis not present
